# Patient Record
Sex: Male | Born: 1980 | Race: White | Hispanic: No | Marital: Married | Smoking: Light tobacco smoker
Health system: Southern US, Community
[De-identification: ages and names within clinical notes are randomized; demographics above are authoritative.]

## PROBLEM LIST (undated history)

## (undated) DIAGNOSIS — B019 Varicella without complication: Secondary | ICD-10-CM

## (undated) HISTORY — DX: Varicella without complication: B01.9

---
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
## 2013-10-14 ENCOUNTER — Ambulatory Visit (INDEPENDENT_AMBULATORY_CARE_PROVIDER_SITE_OTHER): Admitting: Internal Medicine

## 2013-10-14 ENCOUNTER — Encounter: Payer: Self-pay | Admitting: Internal Medicine

## 2013-10-14 VITALS — BP 128/84 | HR 71 | Temp 98.0°F | Ht 75.0 in | Wt 284.8 lb

## 2013-10-14 DIAGNOSIS — R41840 Attention and concentration deficit: Secondary | ICD-10-CM

## 2013-10-14 DIAGNOSIS — Z0000 Encounter for general adult medical examination without abnormal findings: Secondary | ICD-10-CM

## 2013-10-14 DIAGNOSIS — L570 Actinic keratosis: Secondary | ICD-10-CM

## 2013-10-14 DIAGNOSIS — E669 Obesity, unspecified: Secondary | ICD-10-CM | POA: Insufficient documentation

## 2013-10-14 LAB — COMPREHENSIVE METABOLIC PANEL
ALT: 90 U/L — ABNORMAL HIGH (ref 0–53)
AST: 57 U/L — ABNORMAL HIGH (ref 0–37)
Albumin: 4.5 g/dL (ref 3.5–5.2)
Alkaline Phosphatase: 77 U/L (ref 39–117)
Calcium: 9.7 mg/dL (ref 8.4–10.5)
Chloride: 107 mEq/L (ref 96–112)
Creatinine, Ser: 1.1 mg/dL (ref 0.4–1.5)
Glucose, Bld: 86 mg/dL (ref 70–99)
Potassium: 4.3 mEq/L (ref 3.5–5.1)
Sodium: 142 mEq/L (ref 135–145)
Total Bilirubin: 0.7 mg/dL (ref 0.3–1.2)
Total Protein: 7.4 g/dL (ref 6.0–8.3)

## 2013-10-14 LAB — CBC
MCHC: 33.3 g/dL (ref 30.0–36.0)
Platelets: 258 10*3/uL (ref 150.0–400.0)
RBC: 5.45 Mil/uL (ref 4.22–5.81)
RDW: 13.9 % (ref 11.5–14.6)

## 2013-10-14 LAB — HEMOGLOBIN A1C: Hgb A1c MFr Bld: 5.4 % (ref 4.6–6.5)

## 2013-10-14 LAB — TSH: TSH: 1.69 u[IU]/mL (ref 0.35–5.50)

## 2013-10-14 LAB — LIPID PANEL
HDL: 47.2 mg/dL (ref >39.00)
Total CHOL/HDL Ratio: 5

## 2013-10-14 NOTE — Progress Notes (Signed)
HPI Pt presents to the clinic today to establish care. He does not have a PCP. He does have some concerns today about some moles on his body, primarily on his right hip. He would like it checked today. He also recently started law school. He has some concerns about adult onset ADD. He has difficulty completing projects and problems focusing. He has taken online test that suggest he has ADD and he would like to know he could be formally diagnosed.   Flu: yearly- 2014 Tetanus: 2007 Dentist: yearly  No past medical history on file.  No current outpatient prescriptions on file.   No current facility-administered medications for this visit.    No Known Allergies  Family History  Problem Relation Age of Onset  . Diabetes Paternal Uncle   . Cancer Maternal Grandmother   . Heart disease Maternal Grandfather   . Heart disease Paternal Grandfather     History   Social History  . Marital Status: Married    Spouse Name: N/A    Number of Children: N/A  . Years of Education: N/A   Occupational History  . Not on file.   Social History Main Topics  . Smoking status: Light Tobacco Smoker  . Smokeless tobacco: Never Used     Comment: cigars 10-12 a year  . Alcohol Use: Yes     Comment: occasional  . Drug Use: Not on file  . Sexual Activity: Not on file   Other Topics Concern  . Not on file   Social History Narrative  . No narrative on file    ROS:  Constitutional: Denies fever, malaise, fatigue, headache or abrupt weight changes.  HEENT: Denies eye pain, eye redness, ear pain, ringing in the ears, wax buildup, runny nose, nasal congestion, bloody nose, or sore throat. Respiratory: Denies difficulty breathing, shortness of breath, cough or sputum production.   Cardiovascular: Denies chest pain, chest tightness, palpitations or swelling in the hands or feet.  Gastrointestinal: Denies abdominal pain, bloating, constipation, diarrhea or blood in the stool.  GU: Denies frequency,  urgency, pain with urination, blood in urine, odor or discharge. Musculoskeletal: Denies decrease in range of motion, difficulty with gait, muscle pain or joint pain and swelling.  Skin: Denies redness, rashes, or ulcercations.  Neurological: Denies dizziness, difficulty with memory, difficulty with speech or problems with balance and coordination.   No other specific complaints in a complete review of systems (except as listed in HPI above).  PE:  BP 128/84  Pulse 71  Temp(Src) 98 F (36.7 C) (Oral)  Ht 6\' 3"  (1.905 m)  Wt 284 lb 12 oz (129.162 kg)  BMI 35.59 kg/m2  SpO2 98% Wt Readings from Last 3 Encounters:  10/14/13 284 lb 12 oz (129.162 kg)    General: Appears this stated age, obese but well developed, well nourished in NAD. Skin: 8 mm irregular lesion noted on right hip.  HEENT: Head: normal shape and size; Eyes: sclera white, no icterus, conjunctiva pink, PERRLA and EOMs intact; Ears: Tm's gray and intact, normal light reflex; Nose: mucosa pink and moist, septum midline; Throat/Mouth: Teeth present, mucosa pink and moist, no lesions or ulcerations noted.  Neck: Normal range of motion. Neck supple, trachea midline. No massses, lumps or thyromegaly present.  Cardiovascular: Normal rate and rhythm. S1,S2 noted.  No murmur, rubs or gallops noted. No JVD or BLE edema. No carotid bruits noted. Pulmonary/Chest: Normal effort and positive vesicular breath sounds. No respiratory distress. No wheezes, rales or ronchi noted.  Abdomen: Soft and nontender. Normal bowel sounds, no bruits noted. No distention or masses noted. Liver, spleen and kidneys non palpable. Musculoskeletal: Normal range of motion. No signs of joint swelling. No difficulty with gait.  Neurological: Alert and oriented. Cranial nerves II-XII intact. Coordination normal. +DTRs bilaterally. Psychiatric: Mood and affect normal. Behavior is normal. Judgment and thought content normal.   Assessment and Plan:  Preventative  Health Maintenance:  Encouraged pt to work on diet and exercise Will obtain screening labs today Will refer to derm for evaluation of AK/SK on right hip Will refer to psych for evaluation of possible adult onset ADD  RTC in 1 year or sooner if needed

## 2013-10-14 NOTE — Progress Notes (Signed)
Pre-visit discussion using our clinic review tool. No additional management support is needed unless otherwise documented below in the visit note.  

## 2013-10-14 NOTE — Patient Instructions (Signed)
Health Maintenance, Males A healthy lifestyle and preventative care can promote health and wellness.  Maintain regular health, dental, and eye exams.  Eat a healthy diet. Foods like vegetables, fruits, whole grains, low-fat dairy products, and lean protein foods contain the nutrients you need without too many calories. Decrease your intake of foods high in solid fats, added sugars, and salt. Get information about a proper diet from your caregiver, if necessary.  Regular physical exercise is one of the most important things you can do for your health. Most adults should get at least 150 minutes of moderate-intensity exercise (any activity that increases your heart rate and causes you to sweat) each week. In addition, most adults need muscle-strengthening exercises on 2 or more days a week.   Maintain a healthy weight. The body mass index (BMI) is a screening tool to identify possible weight problems. It provides an estimate of body fat based on height and weight. Your caregiver can help determine your BMI, and can help you achieve or maintain a healthy weight. For adults 20 years and older:  A BMI below 18.5 is considered underweight.  A BMI of 18.5 to 24.9 is normal.  A BMI of 25 to 29.9 is considered overweight.  A BMI of 30 and above is considered obese.  Maintain normal blood lipids and cholesterol by exercising and minimizing your intake of saturated fat. Eat a balanced diet with plenty of fruits and vegetables. Blood tests for lipids and cholesterol should begin at age 20 and be repeated every 5 years. If your lipid or cholesterol levels are high, you are over 50, or you are a high risk for heart disease, you may need your cholesterol levels checked more frequently.Ongoing high lipid and cholesterol levels should be treated with medicines, if diet and exercise are not effective.  If you smoke, find out from your caregiver how to quit. If you do not use tobacco, do not start.  Lung  cancer screening is recommended for adults aged 55 80 years who are at high risk for developing lung cancer because of a history of smoking. Yearly low-dose computed tomography (CT) is recommended for people who have at least a 30-pack-year history of smoking and are a current smoker or have quit within the past 15 years. A pack year of smoking is smoking an average of 1 pack of cigarettes a day for 1 year (for example: 1 pack a day for 30 years or 2 packs a day for 15 years). Yearly screening should continue until the smoker has stopped smoking for at least 15 years. Yearly screening should also be stopped for people who develop a health problem that would prevent them from having lung cancer treatment.  If you choose to drink alcohol, do not exceed 2 drinks per day. One drink is considered to be 12 ounces (355 mL) of beer, 5 ounces (148 mL) of wine, or 1.5 ounces (44 mL) of liquor.  Avoid use of street drugs. Do not share needles with anyone. Ask for help if you need support or instructions about stopping the use of drugs.  High blood pressure causes heart disease and increases the risk of stroke. Blood pressure should be checked at least every 1 to 2 years. Ongoing high blood pressure should be treated with medicines if weight loss and exercise are not effective.  If you are 45 to 32 years old, ask your caregiver if you should take aspirin to prevent heart disease.  Diabetes screening involves taking a blood   sample to check your fasting blood sugar level. This should be done once every 3 years, after age 45, if you are within normal weight and without risk factors for diabetes. Testing should be considered at a younger age or be carried out more frequently if you are overweight and have at least 1 risk factor for diabetes.  Colorectal cancer can be detected and often prevented. Most routine colorectal cancer screening begins at the age of 50 and continues through age 75. However, your caregiver may  recommend screening at an earlier age if you have risk factors for colon cancer. On a yearly basis, your caregiver may provide home test kits to check for hidden blood in the stool. Use of a small camera at the end of a tube, to directly examine the colon (sigmoidoscopy or colonoscopy), can detect the earliest forms of colorectal cancer. Talk to your caregiver about this at age 50, when routine screening begins. Direct examination of the colon should be repeated every 5 to 10 years through age 75, unless early forms of pre-cancerous polyps or small growths are found.  Hepatitis C blood testing is recommended for all people born from 1945 through 1965 and any individual with known risks for hepatitis C.  Healthy men should no longer receive prostate-specific antigen (PSA) blood tests as part of routine cancer screening. Consult with your caregiver about prostate cancer screening.  Testicular cancer screening is not recommended for adolescents or adult males who have no symptoms. Screening includes self-exam, caregiver exam, and other screening tests. Consult with your caregiver about any symptoms you have or any concerns you have about testicular cancer.  Practice safe sex. Use condoms and avoid high-risk sexual practices to reduce the spread of sexually transmitted infections (STIs).  Use sunscreen. Apply sunscreen liberally and repeatedly throughout the day. You should seek shade when your shadow is shorter than you. Protect yourself by wearing long sleeves, pants, a wide-brimmed hat, and sunglasses year round, whenever you are outdoors.  Notify your caregiver of new moles or changes in moles, especially if there is a change in shape or color. Also notify your caregiver if a mole is larger than the size of a pencil eraser.  A one-time screening for abdominal aortic aneurysm (AAA) and surgical repair of large AAAs by sound wave imaging (ultrasonography) is recommended for ages 65 to 75 years who are  current or former smokers.  Stay current with your immunizations. Document Released: 03/31/2008 Document Revised: 01/28/2013 Document Reviewed: 02/28/2011 Exit[employer] Patient Information 2014 [employer].  

## 2013-10-15 ENCOUNTER — Other Ambulatory Visit: Payer: Self-pay | Admitting: Internal Medicine

## 2013-10-15 DIAGNOSIS — R748 Abnormal levels of other serum enzymes: Secondary | ICD-10-CM

## 2013-10-24 ENCOUNTER — Other Ambulatory Visit: Payer: Self-pay | Admitting: Internal Medicine

## 2013-10-24 DIAGNOSIS — R41840 Attention and concentration deficit: Secondary | ICD-10-CM

## 2013-11-08 ENCOUNTER — Ambulatory Visit
Admission: RE | Admit: 2013-11-08 | Discharge: 2013-11-08 | Disposition: A | Discharge status: Home / Self Care | Source: Ambulatory Visit | Attending: Internal Medicine | Admitting: Internal Medicine

## 2013-11-08 DIAGNOSIS — R748 Abnormal levels of other serum enzymes: Secondary | ICD-10-CM

## 2013-11-14 ENCOUNTER — Ambulatory Visit (INDEPENDENT_AMBULATORY_CARE_PROVIDER_SITE_OTHER): Admitting: Psychology

## 2013-11-14 DIAGNOSIS — F909 Attention-deficit hyperactivity disorder, unspecified type: Secondary | ICD-10-CM

## 2013-11-15 ENCOUNTER — Other Ambulatory Visit

## 2013-11-19 ENCOUNTER — Other Ambulatory Visit (INDEPENDENT_AMBULATORY_CARE_PROVIDER_SITE_OTHER)

## 2013-11-19 DIAGNOSIS — F909 Attention-deficit hyperactivity disorder, unspecified type: Secondary | ICD-10-CM

## 2013-11-28 ENCOUNTER — Ambulatory Visit (INDEPENDENT_AMBULATORY_CARE_PROVIDER_SITE_OTHER): Admitting: Psychology

## 2013-11-28 DIAGNOSIS — F909 Attention-deficit hyperactivity disorder, unspecified type: Secondary | ICD-10-CM

## 2013-12-12 ENCOUNTER — Ambulatory Visit: Admitting: Psychology

## 2013-12-26 ENCOUNTER — Ambulatory Visit (INDEPENDENT_AMBULATORY_CARE_PROVIDER_SITE_OTHER): Admitting: Psychology

## 2013-12-26 DIAGNOSIS — F909 Attention-deficit hyperactivity disorder, unspecified type: Secondary | ICD-10-CM

## 2013-12-27 ENCOUNTER — Encounter: Payer: Self-pay | Admitting: Radiology

## 2013-12-27 ENCOUNTER — Encounter: Payer: Self-pay | Admitting: Internal Medicine

## 2013-12-27 ENCOUNTER — Ambulatory Visit (INDEPENDENT_AMBULATORY_CARE_PROVIDER_SITE_OTHER): Admitting: Internal Medicine

## 2013-12-27 VITALS — BP 124/80 | HR 64 | Temp 97.9°F | Wt 283.0 lb

## 2013-12-27 DIAGNOSIS — R41840 Attention and concentration deficit: Secondary | ICD-10-CM

## 2013-12-27 MED ORDER — LISDEXAMFETAMINE DIMESYLATE 30 MG PO CAPS: 30.0000 mg | ORAL_CAPSULE | Freq: Every day | ORAL | Status: DC

## 2013-12-27 NOTE — Progress Notes (Signed)
Pre visit review using our clinic review tool, if applicable. No additional management support is needed unless otherwise documented below in the visit note. 

## 2013-12-27 NOTE — Patient Instructions (Addendum)
Lisdexamfetamine Oral Capsule What is this medicine? LISDEXAMFETAMINE (lis DEX am fet a meen) is used to treat attention-deficit hyperactivity disorder (ADHD). Federal law prohibits giving this medicine to any person other than the person for whom it was prescribed. Do not share this medicine with anyone else. This medicine may be used for other purposes; ask your health care provider or pharmacist if you have questions. COMMON BRAND NAME(S): Vyvanse What should I tell my health care provider before I take this medicine? They need to know if you have any of these conditions: -anxiety or panic attacks -circulation problems in fingers and toes -glaucoma -hardening or blockages of the arteries or heart blood vessels -heart disease or a heart defect -high blood pressure -history of a drug or alcohol abuse problem -history of stroke -kidney disease -liver disease -mental illness -seizures -suicidal thoughts, plans, or attempt; a previous suicide attempt by you or a family member -thyroid disease -Tourette's syndrome -an unusual or allergic reaction to lisdexamfetamine, other medicines, foods, dyes, or preservatives -pregnant or trying to get pregnant -breast-feeding How should I use this medicine? Take this medicine by mouth. Follow the directions on the prescription label. Swallow the capsules with a drink of water. You may open capsule and add to a glass of water, then drink right away. Take your doses at regular intervals. Do not take your medicine more often than directed. Do not suddenly stop your medicine. You must gradually reduce the dose or you may feel withdrawal effects. Ask your doctor or health care professional for advice. A special MedGuide will be given to you by the pharmacist with each prescription and refill. Be sure to read this information carefully each time. Talk to your pediatrician regarding the use of this medicine in children. While this drug may be prescribed for  children as young as 6 years of age for selected conditions, precautions do apply. Overdosage: If you think you have taken too much of this medicine contact a poison control center or emergency room at once. NOTE: This medicine is only for you. Do not share this medicine with others. What if I miss a dose? If you miss a dose, take it as soon as you can. If it is almost time for your next dose, take only that dose. Do not take double or extra doses. What may interact with this medicine? Do not take this medicine with any of the following medications: -certain medicines for migraine headache like almotriptan, eletriptan, frovatriptan, naratriptan, rizatriptan, sumatriptan, zolmitriptan -MAOIs like Carbex, Eldepryl, Marplan, Nardil, and Parnate -melatonin -meperidine -other stimulant medicines for attention disorders, weight loss, or to stay awake -pimozide -procarbazine This medicine may also interact with the following medications: -acetazolamide -ammonium chloride -antacids -ascorbic acid -atomoxetine -caffeine -certain medicines for blood pressure -certain medicines for depression, anxiety, or psychotic disturbances -certain medicines for seizures like carbamazepine, phenobarbital, phenytoin -certain medicines for stomach problems like cimetidine, famotidine, omeprazole, lansoprazole -cold or allergy medicines -green tea -levodopa -linezolid -medicines for sleep during surgery -methenamine -norepinephrine -phenothiazines like chlorpromazine, mesoridazine, prochlorperazine, thioridazine -propoxyphene -sodium acid phosphate -sodium bicarbonate This list may not describe all possible interactions. Give your health care provider a list of all the medicines, herbs, non-prescription drugs, or dietary supplements you use. Also tell them if you smoke, drink alcohol, or use illegal drugs. Some items may interact with your medicine. What should I watch for while using this  medicine? Visit your doctor for regular check ups. This prescription requires that you follow special procedures with your   doctor and pharmacy. You will need to have a new written prescription from your doctor every time you need a refill. This medicine may affect your concentration, or hide signs of tiredness. Until you know how this medicine affects you, do not drive, ride a bicycle, use machinery, or do anything that needs mental alertness. Tell your doctor or health care professional if this medicine loses its effects, or if you feel you need to take more than the prescribed amount. Do not change your dose without talking to your doctor or health care professional. Decreased appetite is a common side effect when starting this medicine. Eating small, frequent meals or snacks can help. Talk to your doctor if you continue to have poor eating habits. Height and weight growth of a child taking this medicine will be monitored closely. Do not take this medicine close to bedtime. It may prevent you from sleeping. If you are going to need surgery, a MRI, CT scan, or other procedure, tell your doctor that you are taking this medicine. You may need to stop taking this medicine before the procedure. Tell your doctor or healthcare professional right away if you notice unexplained wounds on your fingers and toes while taking this medicine. You should also tell your healthcare provider if you experience numbness or pain, changes in the skin color, or sensitivity to temperature in your fingers or toes. What side effects may I notice from receiving this medicine? Side effects that you should report to your doctor or health care professional as soon as possible: -allergic reactions like skin rash, itching or hives, swelling of the face, lips, or tongue -changes in vision -chest pain or chest tightness -confusion, trouble speaking or understanding -fast, irregular heartbeat -fingers or toes feel numb, cool,  painful -hallucination, loss of contact with reality -high blood pressure -males: prolonged or painful erection -seizures -severe headaches -shortness of breath -suicidal thoughts or other mood changes -trouble walking, dizziness, loss of balance or coordination -uncontrollable head, mouth, neck, arm, or leg movements Side effects that usually do not require medical attention (report to your doctor or health care professional if they continue or are bothersome): -anxious -headache -loss of appetite -nausea, vomiting -trouble sleeping -weight loss This list may not describe all possible side effects. Call your doctor for medical advice about side effects. You may report side effects to FDA at 1-800-FDA-1088. Where should I keep my medicine? Keep out of the reach of children. This medicine can be abused. Keep your medicine in a safe place to protect it from theft. Do not share this medicine with anyone. Selling or giving away this medicine is dangerous and against the law. Store at room temperature between 15 and 30 degrees C (59 and 86 degrees F). Protect from light. Keep container tightly closed. Throw away any unused medicine after the expiration date. NOTE: This sheet is a summary. It may not cover all possible information. If you have questions about this medicine, talk to your doctor, pharmacist, or health care provider.  2014, [employer]vier/[employer]. (2013-06-24 13:24:34)  

## 2013-12-27 NOTE — Progress Notes (Signed)
Subjective:    Patient ID: Marco Allen, male    DOB: 1981-09-30, 33 y.o.   MRN: [id number]  HPI  Pt presents to the clinic today to f/u inattention and procrastination. 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 He say Salomon Fick, clinical psychologist for ADD testing. She spoke with me yesterday. She felt like he would benefit from a trial of low dose Vyvanse.  Review of Systems  Past Medical History  Diagnosis Date  . Chicken pox     No current outpatient prescriptions on file.   No current facility-administered medications for this visit.    No Known Allergies  Family History  Problem Relation Age of Onset  . Diabetes Paternal Uncle   . Cancer Maternal Grandmother   . Heart disease Maternal Grandfather   . Heart disease Paternal Grandfather     History   Social History  . Marital Status: Married    Spouse Name: N/A    Number of Children: N/A  . Years of Education: N/A   Occupational History  . Not on file.   Social History Main Topics  . Smoking status: Light Tobacco Smoker  . Smokeless tobacco: Never Used     Comment: cigars 10-12 a year  . Alcohol Use: 4.2 oz/week    7 Shots of liquor per week     Comment: occasional  . Drug Use: No  . Sexual Activity: Yes   Other Topics Concern  . Not on file   Social History Narrative  . No narrative on file     Constitutional: Denies fever, malaise, fatigue, headache or abrupt weight changes.   Neurological: Denies dizziness, difficulty with memory, difficulty with speech or problems with balance and coordination.  Psych: Pt reports inattention, difficulty focusing and issues with procrastination. Denies depression, SI/HI.  No other specific complaints in a complete review of systems (except as listed in HPI above).     Objective:   Physical Exam  BP 124/80  Pulse 64  Temp(Src) 97.9 F (36.6 C) (Oral)  Wt 283 lb (128.368 kg)  SpO2 98% Wt Readings from Last 3 Encounters:  12/27/13  283 lb (128.368 kg)  10/14/13 284 lb 12 oz (129.162 kg)    General: Appears his stated age, well developed, well nourished in NAD. Cardiovascular: Normal rate and rhythm. S1,S2 noted.  No murmur, rubs or gallops noted. No JVD or BLE edema. No carotid bruits noted. Pulmonary/Chest: Normal effort and positive vesicular breath sounds. No respiratory distress. No wheezes, rales or ronchi noted.  Psychiatric: Mood and affect normal. Behavior is normal. Judgment and thought content normal.     BMET    Component Value Date/Time   NA 142 10/14/2013 1005   K 4.3 10/14/2013 1005   CL 107 10/14/2013 1005   CO2 21 10/14/2013 1005   GLUCOSE 86 10/14/2013 1005   BUN 12 10/14/2013 1005   CREATININE 1.1 10/14/2013 1005   CALCIUM 9.7 10/14/2013 1005    Lipid Panel     Component Value Date/Time   CHOL 218* 10/14/2013 1005   TRIG 131.0 10/14/2013 1005   HDL 47.20 10/14/2013 1005   CHOLHDL 5 10/14/2013 1005   VLDL 26.2 10/14/2013 1005    CBC    Component Value Date/Time   WBC 6.9 10/14/2013 1005   RBC 5.45 10/14/2013 1005   HGB 15.8 10/14/2013 1005   HCT 47.4 10/14/2013 1005   PLT 258.0 10/14/2013 1005   MCV 87.0 10/14/2013 1005  MCHC 33.3 10/14/2013 1005   RDW 13.9 10/14/2013 1005    Hgb A1C Lab Results  Component Value Date   HGBA1C 5.4 10/14/2013         Assessment & Plan:   Inattention and difficulty focus:  Will start low dose Vyvanse Continue to follow Salomon Fickerri Bauert Will need to set up with [facility]  RTC in 1 month to reassess medication effectiveness

## 2013-12-30 ENCOUNTER — Telehealth: Payer: Self-pay | Admitting: Internal Medicine

## 2013-12-30 NOTE — Telephone Encounter (Signed)
Relevant patient education assigned to patient using Emmi. ° °

## 2014-01-09 ENCOUNTER — Ambulatory Visit (INDEPENDENT_AMBULATORY_CARE_PROVIDER_SITE_OTHER): Admitting: Psychology

## 2014-01-09 DIAGNOSIS — F909 Attention-deficit hyperactivity disorder, unspecified type: Secondary | ICD-10-CM

## 2014-01-28 ENCOUNTER — Other Ambulatory Visit: Payer: Self-pay | Admitting: Family Medicine

## 2014-01-28 DIAGNOSIS — R41840 Attention and concentration deficit: Secondary | ICD-10-CM

## 2014-01-28 MED ORDER — LISDEXAMFETAMINE DIMESYLATE 30 MG PO CAPS: 30.0000 mg | ORAL_CAPSULE | Freq: Every day | ORAL | Status: DC

## 2014-01-28 NOTE — Telephone Encounter (Signed)
Last filled 12/27/13 

## 2014-01-29 NOTE — Telephone Encounter (Signed)
Rx faxed to [facility] 

## 2014-01-30 ENCOUNTER — Ambulatory Visit (INDEPENDENT_AMBULATORY_CARE_PROVIDER_SITE_OTHER): Admitting: Psychology

## 2014-01-30 DIAGNOSIS — F909 Attention-deficit hyperactivity disorder, unspecified type: Secondary | ICD-10-CM

## 2014-01-31 ENCOUNTER — Telehealth: Payer: Self-pay

## 2014-01-31 NOTE — Telephone Encounter (Signed)
Pt left note; pt saw Terri Bauert and Terri recommended pts Vyvanxe increased from 30 mg to 40 mg. Pt request cb.

## 2014-02-03 ENCOUNTER — Other Ambulatory Visit: Payer: Self-pay

## 2014-02-03 ENCOUNTER — Other Ambulatory Visit: Payer: Self-pay | Admitting: Internal Medicine

## 2014-02-03 MED ORDER — LISDEXAMFETAMINE DIMESYLATE 40 MG PO CAPS: 40.0000 mg | ORAL_CAPSULE | ORAL | Status: DC

## 2014-02-03 NOTE — Telephone Encounter (Signed)
Done, he will need to finish out his current prescription first, then fill the 40 mg tabs

## 2014-02-03 NOTE — Telephone Encounter (Signed)
lmovm for pt to return call--Rx is ready to be picked up and cannot be filled until after 02/27/2014--upon finishing previous Rx

## 2014-02-11 NOTE — Telephone Encounter (Signed)
lmovm for pt to return call--Rx left in front office for pick up

## 2014-02-27 ENCOUNTER — Ambulatory Visit (INDEPENDENT_AMBULATORY_CARE_PROVIDER_SITE_OTHER): Admitting: Psychology

## 2014-02-27 DIAGNOSIS — F909 Attention-deficit hyperactivity disorder, unspecified type: Secondary | ICD-10-CM

## 2014-04-02 ENCOUNTER — Other Ambulatory Visit: Payer: Self-pay

## 2014-04-02 MED ORDER — LISDEXAMFETAMINE DIMESYLATE 40 MG PO CAPS: 40.0000 mg | ORAL_CAPSULE | ORAL | Status: DC

## 2014-04-02 NOTE — Telephone Encounter (Signed)
RX printed and signed 

## 2014-04-02 NOTE — Telephone Encounter (Signed)
Pt left v/m requesting rx vyvanse.call when ready for pick up.

## 2014-04-02 NOTE — Telephone Encounter (Signed)
Rx left in front office for pick up and pt is aware  

## 2014-05-09 ENCOUNTER — Other Ambulatory Visit: Payer: Self-pay

## 2014-05-09 NOTE — Telephone Encounter (Signed)
Pt left v/m requesting rx Vyvanse. Call when ready for pick up. 

## 2014-05-12 MED ORDER — LISDEXAMFETAMINE DIMESYLATE 40 MG PO CAPS: 40.0000 mg | ORAL_CAPSULE | ORAL | Status: DC

## 2014-05-12 NOTE — Telephone Encounter (Signed)
Rx printed and signed, placed in your inbox

## 2014-05-12 NOTE — Telephone Encounter (Signed)
lmovm for pt to return my call--Rx placed in front office for pt to pick up

## 2014-05-14 NOTE — Telephone Encounter (Signed)
Pt returned call and he is aware of Rx being ready for pick up

## 2014-06-20 ENCOUNTER — Other Ambulatory Visit: Payer: Self-pay

## 2014-06-20 NOTE — Telephone Encounter (Signed)
Pt left v/m requesting rx vyvanse. Call when ready for pick up. 

## 2014-06-24 MED ORDER — LISDEXAMFETAMINE DIMESYLATE 40 MG PO CAPS: 40.0000 mg | ORAL_CAPSULE | ORAL | Status: DC

## 2014-06-24 NOTE — Telephone Encounter (Signed)
Pt walked into office and picked up Rx

## 2014-06-24 NOTE — Telephone Encounter (Signed)
RX printed and signed 

## 2014-08-01 ENCOUNTER — Telehealth: Payer: Self-pay

## 2014-08-01 NOTE — Telephone Encounter (Signed)
[facility] okay [facility] med, pt said he didn't need the 40mg  refilled right now he will wait

## 2014-08-01 NOTE — Telephone Encounter (Signed)
Ok - will make sure this is routed to her

## 2014-08-01 NOTE — Telephone Encounter (Signed)
Pt left v/m; pt request increase of Vyvanse to 50 mg instead of 40 mg. Pt cannot come in for appt at this time and if cannot increase med please refill Vyvanse at 40 mg. Pt request cb when rx ready for pick up.

## 2014-08-01 NOTE — Telephone Encounter (Signed)
Since he is Regina's pt- an increase needs to go by her - ? If she is here - if she is gone and he just wants refill of 40 I can do that /otherwise please send to her for her response

## 2014-08-07 NOTE — Telephone Encounter (Signed)
Pt left vm requesting status of vyvanse 50 mg rx. Pt request cb when answered by Nicki Reaperegina Baity NP.

## 2014-08-08 ENCOUNTER — Telehealth: Payer: Self-pay | Admitting: Internal Medicine

## 2014-08-08 NOTE — Telephone Encounter (Signed)
Left message on voicemail.

## 2014-08-08 NOTE — Telephone Encounter (Signed)
Patient returned your call.

## 2014-08-08 NOTE — Telephone Encounter (Signed)
I have not seen him since march. He needs to follow up to discuss.d

## 2014-08-08 NOTE — Telephone Encounter (Signed)
[facility]

## 2014-08-15 NOTE — Telephone Encounter (Signed)
Left message on voicemail.

## 2014-08-22 ENCOUNTER — Encounter: Payer: Self-pay | Admitting: Internal Medicine

## 2014-08-22 ENCOUNTER — Ambulatory Visit (INDEPENDENT_AMBULATORY_CARE_PROVIDER_SITE_OTHER): Admitting: Internal Medicine

## 2014-08-22 VITALS — BP 132/82 | HR 89 | Temp 98.7°F | Wt 258.0 lb

## 2014-08-22 DIAGNOSIS — R682 Dry mouth, unspecified: Secondary | ICD-10-CM

## 2014-08-22 DIAGNOSIS — Z23 Encounter for immunization: Secondary | ICD-10-CM

## 2014-08-22 DIAGNOSIS — F988 Other specified behavioral and emotional disorders with onset usually occurring in childhood and adolescence: Secondary | ICD-10-CM | POA: Insufficient documentation

## 2014-08-22 DIAGNOSIS — F909 Attention-deficit hyperactivity disorder, unspecified type: Secondary | ICD-10-CM

## 2014-08-22 MED ORDER — LISDEXAMFETAMINE DIMESYLATE 60 MG PO CAPS: 60.0000 mg | ORAL_CAPSULE | ORAL | Status: DC

## 2014-08-22 NOTE — Progress Notes (Signed)
Pre visit review using our clinic review tool, if applicable. No additional management support is needed unless otherwise documented below in the visit note. 

## 2014-08-22 NOTE — Assessment & Plan Note (Signed)
Will increase Vyvanse to 60 mg daily Watch for increased dry mouth- suck on hard candy (this will help)

## 2014-08-22 NOTE — Patient Instructions (Signed)

## 2014-08-22 NOTE — Progress Notes (Signed)
Subjective:    Patient ID: Marco Allen, male    DOB: 10-25-1980, 33 y.o.   MRN: [id number]  HPI  Pt presents to the clinic today to discuss increasing his Vyvanse. He was started on this medication 3/15 after evaluation from Theresia Loerri Bauret, psychologist. She noted that he did have issues focusing and with inattention. 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 Since starting the medication, he has noticed that the medication wears off pretty quickly. It seems to only last 5-6 hours. He is only taking it a few days per week, on his long days. He has noticed some dry mouth but no other side effects. Of note, he has lost 25 lbs since starting this medication. He does attribute this to increase in his diet and exercise and not due to the medication.  Review of Systems      Past Medical History  Diagnosis Date  . Chicken pox     Current Outpatient Prescriptions  Medication Sig Dispense Refill  . lisdexamfetamine (VYVANSE) 40 MG capsule Take 1 capsule (40 mg total) by mouth every morning. 30 capsule 0   No current facility-administered medications for this visit.    No Known Allergies  Family History  Problem Relation Age of Onset  . Diabetes Paternal Uncle   . Cancer Maternal Grandmother   . Heart disease Maternal Grandfather   . Heart disease Paternal Grandfather     History   Social History  . Marital Status: Married    Spouse Name: N/A    Number of Children: N/A  . Years of Education: N/A   Occupational History  . Not on file.   Social History Main Topics  . Smoking status: Light Tobacco Smoker  . Smokeless tobacco: Never Used     Comment: cigars 10-12 a year  . Alcohol Use: 4.2 oz/week    7 Shots of liquor per week     Comment: occasional  . Drug Use: No  . Sexual Activity: Yes   Other Topics Concern  . Not on file   Social History Narrative     Constitutional: Denies fever, malaise, fatigue, headache or abrupt weight  changes.  Respiratory: Denies difficulty breathing, shortness of breath, cough or sputum production.   Cardiovascular: Denies chest pain, chest tightness, palpitations or swelling in the hands or feet.  Neurological: Pt reports inattention and difficulty focusing. Denies dizziness, difficulty with memory, difficulty with speech or problems with balance and coordination.   No other specific complaints in a complete review of systems (except as listed in HPI above).  Objective:   Physical Exam  BP 132/82 mmHg  Pulse 89  Temp(Src) 98.7 F (37.1 C) (Oral)  Wt 258 lb (117.028 kg)  SpO2 98% Wt Readings from Last 3 Encounters:  08/22/14 258 lb (117.028 kg)  12/27/13 283 lb (128.368 kg)  10/14/13 284 lb 12 oz (129.162 kg)    General: Appears his stated age, obese but well developed, well nourished in NAD. HEENT: Head: normal shape and size; Throat/Mouth: Teeth present, mucosa pink and dry, no exudate, lesions or ulcerations noted.  Cardiovascular: Normal rate and rhythm. S1,S2 noted.  No murmur, rubs or gallops noted.  Pulmonary/Chest: Normal effort and positive vesicular breath sounds. No respiratory distress. No wheezes, rales or ronchi noted.  Neurological: Alert and oriented. Cranial nerves II-XII grossly intact.  Psychiatric: Mood and affect normal. Behavior is normal. Judgment and thought content normal.     BMET  Component Value Date/Time   NA 142 10/14/2013 1005   K 4.3 10/14/2013 1005   CL 107 10/14/2013 1005   CO2 21 10/14/2013 1005   GLUCOSE 86 10/14/2013 1005   BUN 12 10/14/2013 1005   CREATININE 1.1 10/14/2013 1005   CALCIUM 9.7 10/14/2013 1005    Lipid Panel     Component Value Date/Time   CHOL 218* 10/14/2013 1005   TRIG 131.0 10/14/2013 1005   HDL 47.20 10/14/2013 1005   CHOLHDL 5 10/14/2013 1005   VLDL 26.2 10/14/2013 1005    CBC    Component Value Date/Time   WBC 6.9 10/14/2013 1005   RBC 5.45 10/14/2013 1005   HGB 15.8 10/14/2013 1005   HCT  47.4 10/14/2013 1005   PLT 258.0 10/14/2013 1005   MCV 87.0 10/14/2013 1005   MCHC 33.3 10/14/2013 1005   RDW 13.9 10/14/2013 1005    Hgb A1C Lab Results  Component Value Date   HGBA1C 5.4 10/14/2013         Assessment & Plan:   Flu shot today

## 2014-08-22 NOTE — Addendum Note (Signed)
Addended by: Roena MaladyEVONTENNO, Monserrath Junio Y on: 08/22/2014 03:17 PM   Modules accepted: Orders

## 2014-10-24 ENCOUNTER — Encounter: Payer: Self-pay | Admitting: Internal Medicine

## 2014-10-24 ENCOUNTER — Ambulatory Visit (INDEPENDENT_AMBULATORY_CARE_PROVIDER_SITE_OTHER): Admitting: Internal Medicine

## 2014-10-24 VITALS — BP 134/88 | HR 84 | Temp 98.1°F | Wt 252.0 lb

## 2014-10-24 DIAGNOSIS — F988 Other specified behavioral and emotional disorders with onset usually occurring in childhood and adolescence: Secondary | ICD-10-CM

## 2014-10-24 DIAGNOSIS — F909 Attention-deficit hyperactivity disorder, unspecified type: Secondary | ICD-10-CM

## 2014-10-24 DIAGNOSIS — K760 Fatty (change of) liver, not elsewhere classified: Secondary | ICD-10-CM | POA: Insufficient documentation

## 2014-10-24 MED ORDER — LISDEXAMFETAMINE DIMESYLATE 50 MG PO CAPS: 50.0000 mg | ORAL_CAPSULE | Freq: Every day | ORAL | Status: DC

## 2014-10-24 NOTE — Progress Notes (Deleted)
Subjective:     Patient ID: Marco GalasJoshua Allen, male   DOB: 01/11/1981, 34 y.o.   MRN: [id number]  HPI   Review of Systems     Objective:   Physical Exam     Assessment:     ***    Plan:     ***

## 2014-10-24 NOTE — Patient Instructions (Signed)
Fatty Liver  Fatty liver is the accumulation of fat in liver cells. It is also called hepatosteatosis or steatohepatitis. It is normal for your liver to contain some fat. If fat is more than 5 to 10% of your liver's weight, you have fatty liver.   There are often no symptoms (problems) for years while damage is still occurring. People often learn about their fatty liver when they have medical tests for other reasons. Fat can damage your liver for years or even decades without causing problems. When it becomes severe, it can cause fatigue, weight loss, weakness, and confusion.  This makes you more likely to develop more serious liver problems. The liver is the largest organ in the body. It does a lot of work and often gives no warning signs when it is sick until late in a disease.  The liver has many important jobs including:  · Breaking down foods.  · Storing vitamins, iron, and other minerals.  · Making proteins.  · Making bile for food digestion.  · Breaking down many products including medications, alcohol and some poisons.  CAUSES   There are a number of different conditions, medications, and poisons that can cause a fatty liver. Eating too many calories causes fat to build up in the liver. Not processing and breaking fats down normally may also cause this. Certain conditions, such as obesity, diabetes, and high triglycerides also cause this. Most fatty liver patients tend to be middle-aged and over weight.   Some causes of fatty liver are:  · Alcohol over consumption.  · Malnutrition.  · Steroid use.  · Valproic acid toxicity.  · Obesity.  · Cushing's syndrome.  · Poisons.  · Tetracycline in high dosages.  · Pregnancy.  · Diabetes.  · Hyperlipidemia.  · Rapid weight loss.  Some people develop fatty liver even having none of these conditions.  SYMPTOMS   Fatty liver most often causes no problems. This is called asymptomatic.  · It can be diagnosed with blood tests and also by a liver biopsy.  · It is one of the  most common causes of minor elevations of liver enzymes on routine blood tests.  · Specialized Imaging of the liver using ultrasound, CT (computed tomography) scan, or MRI (magnetic resonance imaging) can suggest a fatty liver but a biopsy is needed to confirm it.  · A biopsy involves taking a small sample of liver tissue. This is done by using a needle. It is then looked at under a microscope by a specialist.  TREATMENT   It is important to treat the cause. Simple fatty liver without a medical reason may not need treatment.  · Weight loss, fat restriction, and exercise in overweight patients produces inconsistent results but is worth trying.  · Fatty liver due to alcohol toxicity may not improve even with stopping drinking.  · Good control of diabetes may reduce fatty liver.  · Lower your triglycerides through diet, medication or both.  · Eat a balanced, healthy diet.  · Increase your physical activity.  · Get regular checkups from a liver specialist.  · There are no medical or surgical treatments for a fatty liver or NASH, but improving your diet and increasing your exercise may help prevent or reverse some of the damage.  PROGNOSIS   Fatty liver may cause no damage or it can lead to an inflammation of the liver. This is, called steatohepatitis. When it is linked to alcohol abuse, it is called alcoholic steatohepatitis. It often   is not linked to alcohol. It is then called nonalcoholic steatohepatitis, or NASH. Over time the liver may become scarred and hardened. This condition is called cirrhosis. Cirrhosis is serious and may lead to liver failure or cancer. NASH is one of the leading causes of cirrhosis. About 10-20% of Americans have fatty liver and a smaller 2-5% has NASH.  Document Released: 11/18/2005 Document Revised: 12/26/2011 Document Reviewed: 02/12/2014  [employer]® Patient Information ©2015 [employer]. This information is not intended to replace advice given to you by your health care provider. Make  sure you discuss any questions you have with your health care provider.

## 2014-10-24 NOTE — Progress Notes (Signed)
Subjective:    Patient ID: Marco Allen, male    DOB: February 13, 1981, 34 y.o.   MRN: [id number]  HPI  Pt presents to the clinic today to follow up ADD. He reports the Vyvanse 60 mg was too strong. It made him feel jittery. He also had some insomnia that he feels was caused by the dose being too high. He reports the 40 mg was not strong enough. He is wondering if he is able to try the 50 mg dose.  Additionally, he would like to have his liver enzymes rechecked. He had elevated liver enzymes 09/2013. Abdominal ultrasound showed fatty liver. He has not had his liver checked in the last year.  Review of Systems      Past Medical History  Diagnosis Date  . Chicken pox     Current Outpatient Prescriptions  Medication Sig Dispense Refill  . lisdexamfetamine (VYVANSE) 50 MG capsule Take 1 capsule (50 mg total) by mouth daily. 30 capsule 0   No current facility-administered medications for this visit.    No Known Allergies  Family History  Problem Relation Age of Onset  . Diabetes Paternal Uncle   . Cancer Maternal Grandmother   . Heart disease Maternal Grandfather   . Heart disease Paternal Grandfather     History   Social History  . Marital Status: Married    Spouse Name: N/A    Number of Children: N/A  . Years of Education: N/A   Occupational History  . Not on file.   Social History Main Topics  . Smoking status: Light Tobacco Smoker  . Smokeless tobacco: Never Used     Comment: cigars 10-12 a year  . Alcohol Use: 4.2 oz/week    7 Shots of liquor per week     Comment: occasional  . Drug Use: No  . Sexual Activity: Yes   Other Topics Concern  . Not on file   Social History Narrative     Constitutional: Denies fever, malaise, fatigue, headache or abrupt weight changes.  Respiratory: Denies difficulty breathing, shortness of breath, cough or sputum production.   Cardiovascular: Denies chest pain, chest tightness, palpitations or swelling in the hands or feet.    Gastrointestinal: Denies abdominal pain, bloating, constipation, diarrhea or blood in the stool.  Neurological: Pt reports inattention and difficulty focusing. Denies dizziness, difficulty with speech or problems with balance and coordination.   No other specific complaints in a complete review of systems (except as listed in HPI above).  Objective:   Physical Exam  BP 134/88 mmHg  Pulse 84  Temp(Src) 98.1 F (36.7 C) (Oral)  Wt 252 lb (114.306 kg)  SpO2 99% Wt Readings from Last 3 Encounters:  10/24/14 252 lb (114.306 kg)  08/22/14 258 lb (117.028 kg)  12/27/13 283 lb (128.368 kg)    General: Appears his stated age, well developed, well nourished in NAD.Marland Kitchen  Cardiovascular: Normal rate and rhythm. S1,S2 noted.  No murmur, rubs or gallops noted.  Pulmonary/Chest: Normal effort and positive vesicular breath sounds. No respiratory distress. No wheezes, rales or ronchi noted.  Abdomen: Soft and nontender. Normal bowel sounds, no bruits noted. No distention or masses noted. Liver, spleen and kidneys non palpable. Neurological: Alert and oriented. Psychiatric: Mood and affect normal. Behavior is normal. Judgment and thought content normal.    BMET    Component Value Date/Time   NA 142 10/14/2013 1005   K 4.3 10/14/2013 1005   CL 107 10/14/2013 1005   CO2 21 10/14/2013  1005   GLUCOSE 86 10/14/2013 1005   BUN 12 10/14/2013 1005   CREATININE 1.1 10/14/2013 1005   CALCIUM 9.7 10/14/2013 1005    Lipid Panel     Component Value Date/Time   CHOL 218* 10/14/2013 1005   TRIG 131.0 10/14/2013 1005   HDL 47.20 10/14/2013 1005   CHOLHDL 5 10/14/2013 1005   VLDL 26.2 10/14/2013 1005    CBC    Component Value Date/Time   WBC 6.9 10/14/2013 1005   RBC 5.45 10/14/2013 1005   HGB 15.8 10/14/2013 1005   HCT 47.4 10/14/2013 1005   PLT 258.0 10/14/2013 1005   MCV 87.0 10/14/2013 1005   MCHC 33.3 10/14/2013 1005   RDW 13.9 10/14/2013 1005    Hgb A1C Lab Results  Component  Value Date   HGBA1C 5.4 10/14/2013         Assessment & Plan:

## 2014-10-24 NOTE — Assessment & Plan Note (Signed)
Will try the 50 mg dose of Vyvanse

## 2014-10-24 NOTE — Progress Notes (Signed)
Pre visit review using our clinic review tool, if applicable. No additional management support is needed unless otherwise documented below in the visit note. 

## 2014-10-24 NOTE — Assessment & Plan Note (Signed)
Will repeat CMET today- pt left without having labs done

## 2014-10-30 ENCOUNTER — Ambulatory Visit (INDEPENDENT_AMBULATORY_CARE_PROVIDER_SITE_OTHER): Admitting: Internal Medicine

## 2014-10-30 ENCOUNTER — Encounter: Payer: Self-pay | Admitting: Internal Medicine

## 2014-10-30 VITALS — BP 128/84 | HR 88 | Temp 99.0°F | Ht 74.66 in | Wt 257.0 lb

## 2014-10-30 DIAGNOSIS — Z0000 Encounter for general adult medical examination without abnormal findings: Secondary | ICD-10-CM

## 2014-10-30 LAB — COMPREHENSIVE METABOLIC PANEL
ALT: 34 U/L (ref 0–53)
AST: 25 U/L (ref 0–37)
Albumin: 4.5 g/dL (ref 3.5–5.2)
Alkaline Phosphatase: 84 U/L (ref 39–117)
BUN: 11 mg/dL (ref 6–23)
CO2: 27 mEq/L (ref 19–32)
CREATININE: 1.01 mg/dL (ref 0.40–1.50)
Calcium: 9.5 mg/dL (ref 8.4–10.5)
Chloride: 105 mEq/L (ref 96–112)
GFR: 90.06 mL/min (ref >60.00)
GLUCOSE: 91 mg/dL (ref 70–99)
POTASSIUM: 3.9 meq/L (ref 3.5–5.1)
Sodium: 138 mEq/L (ref 135–145)
Total Bilirubin: 0.5 mg/dL (ref 0.2–1.2)
Total Protein: 7.4 g/dL (ref 6.0–8.3)

## 2014-10-30 LAB — CBC
HCT: 47 % (ref 39.0–52.0)
Hemoglobin: 15.4 g/dL (ref 13.0–17.0)
MCHC: 32.9 g/dL (ref 30.0–36.0)
MCV: 87.6 fl (ref 78.0–100.0)
PLATELETS: 304 10*3/uL (ref 150.0–400.0)
RBC: 5.36 Mil/uL (ref 4.22–5.81)
RDW: 13 % (ref 11.5–15.5)
WBC: 9.1 10*3/uL (ref 4.0–10.5)

## 2014-10-30 LAB — LIPID PANEL
CHOL/HDL RATIO: 4
Cholesterol: 174 mg/dL (ref 0–200)
HDL: 45.8 mg/dL (ref >39.00)
LDL Cholesterol: 91 mg/dL (ref 0–99)
NonHDL: 128.2
Triglycerides: 187 mg/dL — ABNORMAL HIGH (ref 0.0–149.0)
VLDL: 37.4 mg/dL (ref 0.0–40.0)

## 2014-10-30 NOTE — Progress Notes (Signed)
Pre visit review using our clinic review tool, if applicable. No additional management support is needed unless otherwise documented below in the visit note. 

## 2014-10-30 NOTE — Progress Notes (Signed)
Subjective:    Patient ID: Marco Allen, male    DOB: 1981/08/22, 34 y.o.   MRN: [id number]  HPI  Pt presents to the clinic today for his physical exam. He would also like to have his liver functions rechecked today as they were slightly elevated today.  Flu: 08/2014 Tetanus: 2007 Dentist: biannually  Diet: Eating smaller portions, incorporated more fruits and veggies in his diet Exercise: Physical training while be in the [employer].  Review of Systems      Past Medical History  Diagnosis Date  . Chicken pox     Current Outpatient Prescriptions  Medication Sig Dispense Refill  . lisdexamfetamine (VYVANSE) 50 MG capsule Take 1 capsule (50 mg total) by mouth daily. 30 capsule 0   No current facility-administered medications for this visit.    No Known Allergies  Family History  Problem Relation Age of Onset  . Diabetes Paternal Uncle   . Cancer Maternal Grandmother   . Heart disease Maternal Grandfather   . Heart disease Paternal Grandfather     History   Social History  . Marital Status: Married    Spouse Name: N/A    Number of Children: N/A  . Years of Education: N/A   Occupational History  . Not on file.   Social History Main Topics  . Smoking status: Light Tobacco Smoker  . Smokeless tobacco: Never Used     Comment: cigars 10-12 a year  . Alcohol Use: 4.2 oz/week    7 Shots of liquor per week     Comment: occasional  . Drug Use: No  . Sexual Activity: Yes   Other Topics Concern  . Not on file   Social History Narrative     Constitutional: Denies fever, malaise, fatigue, headache or abrupt weight changes.  HEENT: Denies eye pain, eye redness, ear pain, ringing in the ears, wax buildup, runny nose, nasal congestion, bloody nose, or sore throat. Respiratory: Denies difficulty breathing, shortness of breath, cough or sputum production.   Cardiovascular: Denies chest pain, chest tightness, palpitations or swelling in the hands or feet.    Gastrointestinal: Denies abdominal pain, bloating, constipation, diarrhea or blood in the stool.  GU: Denies urgency, frequency, pain with urination, burning sensation, blood in urine, odor or discharge. Musculoskeletal: Denies decrease in range of motion, difficulty with gait, muscle pain or joint pain and swelling.  Skin: Denies redness, rashes, lesions or ulcercations.  Neurological: Denies dizziness, difficulty with memory, difficulty with speech or problems with balance and coordination.   No other specific complaints in a complete review of systems (except as listed in HPI above).  Objective:   Physical Exam  BP 128/84 mmHg  Pulse 88  Temp(Src) 99 F (37.2 C) (Oral)  Ht 6' 2.66" (1.896 m)  Wt 257 lb (116.574 kg)  BMI 32.43 kg/m2  SpO2 98% Wt Readings from Last 3 Encounters:  10/30/14 257 lb (116.574 kg)  10/24/14 252 lb (114.306 kg)  08/22/14 258 lb (117.028 kg)    General: Appears his stated age, well developed, well nourished in NAD. Skin: Warm, dry and intact. No rashes, lesions or ulcerations noted. HEENT: Head: normal shape and size; Eyes: sclera white, no icterus, conjunctiva pink, PERRLA and EOMs intact; Ears: Tm's gray and intact, normal light reflex; Nose: mucosa pink and moist, septum midline; Throat/Mouth: Teeth present, mucosa pink and moist, no exudate, lesions or ulcerations noted.  Neck:  Neck supple, trachea midline. No masses, lumps or thyromegaly present.  Cardiovascular: Normal rate and  rhythm. S1,S2 noted.  No murmur, rubs or gallops noted. No JVD or BLE edema. No carotid bruits noted. Pulmonary/Chest: Normal effort and positive vesicular breath sounds. No respiratory distress. No wheezes, rales or ronchi noted.  Abdomen: Soft and nontender. Normal bowel sounds, no bruits noted. No distention or masses noted. Liver, spleen and kidneys non palpable. Musculoskeletal: Normal range of motion. Strength 5/5 BUE/BLE. No difficulty with gait.  Neurological: Alert  and oriented. Cranial nerves II-XII grossly intact. Coordination normal.  Psychiatric: Mood and affect normal. Behavior is normal. Judgment and thought content normal.     BMET    Component Value Date/Time   NA 142 10/14/2013 1005   K 4.3 10/14/2013 1005   CL 107 10/14/2013 1005   CO2 21 10/14/2013 1005   GLUCOSE 86 10/14/2013 1005   BUN 12 10/14/2013 1005   CREATININE 1.1 10/14/2013 1005   CALCIUM 9.7 10/14/2013 1005    Lipid Panel     Component Value Date/Time   CHOL 218* 10/14/2013 1005   TRIG 131.0 10/14/2013 1005   HDL 47.20 10/14/2013 1005   CHOLHDL 5 10/14/2013 1005   VLDL 26.2 10/14/2013 1005    CBC    Component Value Date/Time   WBC 6.9 10/14/2013 1005   RBC 5.45 10/14/2013 1005   HGB 15.8 10/14/2013 1005   HCT 47.4 10/14/2013 1005   PLT 258.0 10/14/2013 1005   MCV 87.0 10/14/2013 1005   MCHC 33.3 10/14/2013 1005   RDW 13.9 10/14/2013 1005    Hgb A1C Lab Results  Component Value Date   HGBA1C 5.4 10/14/2013         Assessment & Plan:   Preventative Health Maintenance:  Encouraged him to continue to work on diet and exercise Will check CBC, CMET and Lipid today All HM UTD  RTC in 1 year or sooner if needed

## 2014-10-30 NOTE — Patient Instructions (Addendum)

## 2014-12-09 ENCOUNTER — Telehealth: Payer: Self-pay | Admitting: Internal Medicine

## 2014-12-09 DIAGNOSIS — F988 Other specified behavioral and emotional disorders with onset usually occurring in childhood and adolescence: Secondary | ICD-10-CM

## 2014-12-09 NOTE — Telephone Encounter (Signed)
Pt came in and requesting refill for vyvanse 50 mg

## 2014-12-09 NOTE — Telephone Encounter (Signed)
Pt dropped off some immunization forms to be updated. Once complete you can call him [phone] to pick up. I have placed the forms on your desk [place]. Thank you.

## 2014-12-12 MED ORDER — LISDEXAMFETAMINE DIMESYLATE 50 MG PO CAPS: 50.0000 mg | ORAL_CAPSULE | Freq: Every day | ORAL | Status: DC

## 2014-12-12 NOTE — Addendum Note (Signed)
Addended by: Roena MaladyEVONTENNO, MELANIE Y on: 12/12/2014 11:36 AM   Modules accepted: Orders

## 2014-12-12 NOTE — Telephone Encounter (Signed)
Rx left in front office for pick up and pt is aware  

## 2014-12-25 ENCOUNTER — Encounter: Payer: Self-pay | Admitting: Internal Medicine

## 2014-12-25 ENCOUNTER — Encounter: Payer: Self-pay | Admitting: *Deleted

## 2015-01-14 ENCOUNTER — Encounter: Payer: Self-pay | Admitting: Internal Medicine

## 2015-01-28 ENCOUNTER — Other Ambulatory Visit: Payer: Self-pay

## 2015-01-28 DIAGNOSIS — F988 Other specified behavioral and emotional disorders with onset usually occurring in childhood and adolescence: Secondary | ICD-10-CM

## 2015-01-28 MED ORDER — LISDEXAMFETAMINE DIMESYLATE 50 MG PO CAPS: 50.0000 mg | ORAL_CAPSULE | Freq: Every day | ORAL | Status: DC

## 2015-01-28 NOTE — Telephone Encounter (Signed)
Rx left in front office for pick up and pt is aware  

## 2015-01-28 NOTE — Telephone Encounter (Signed)
RX printed and signed and placed in MYD box 

## 2015-01-28 NOTE — Telephone Encounter (Signed)
Pt left v/m requesting rx vyvanse. Call when ready for pick up. Pt last seen 10/30/14.

## 2015-02-19 IMAGING — US US ABDOMEN COMPLETE
1 series · 14 of 25 positions shown · non-contrast
Comparison: None.

CLINICAL DATA: Elevated LFTs

EXAM:
ULTRASOUND ABDOMEN COMPLETE

[Series 1: us abdomen complete · 0.35mm/px · 14 of 74 slices shown]
[im 1/74]
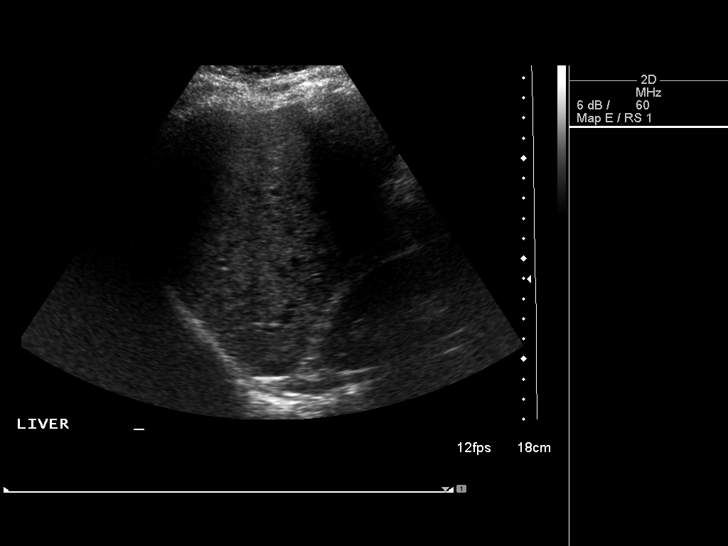
[im 7/74]
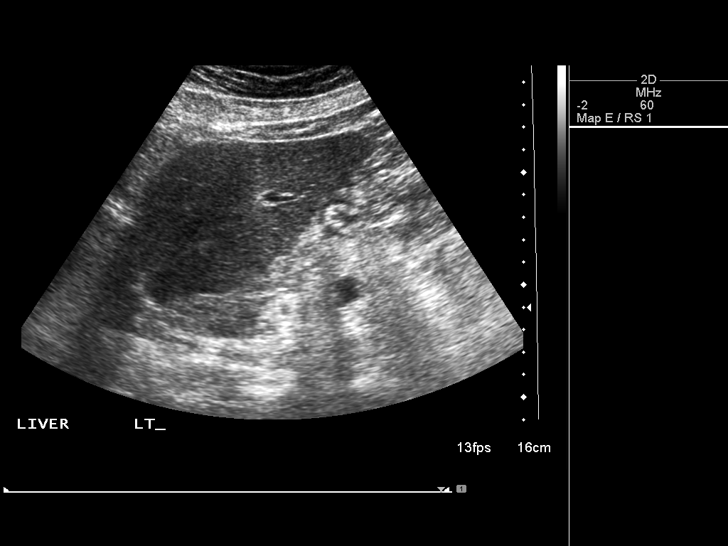
[im 13/74]
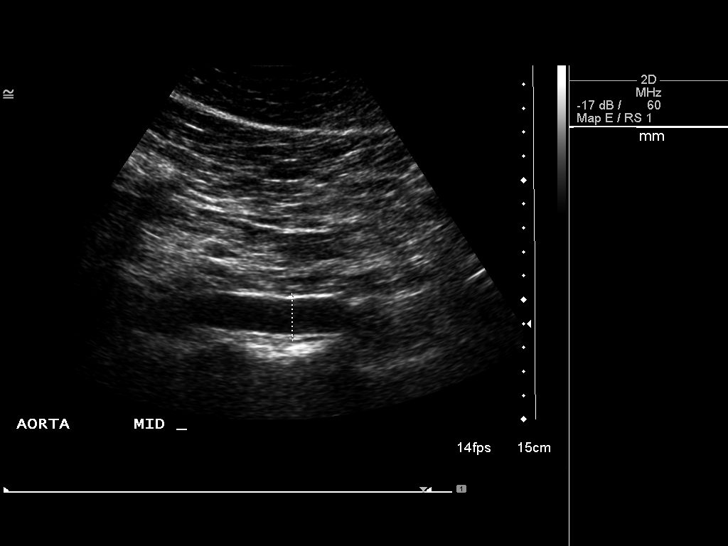
[im 19/74]
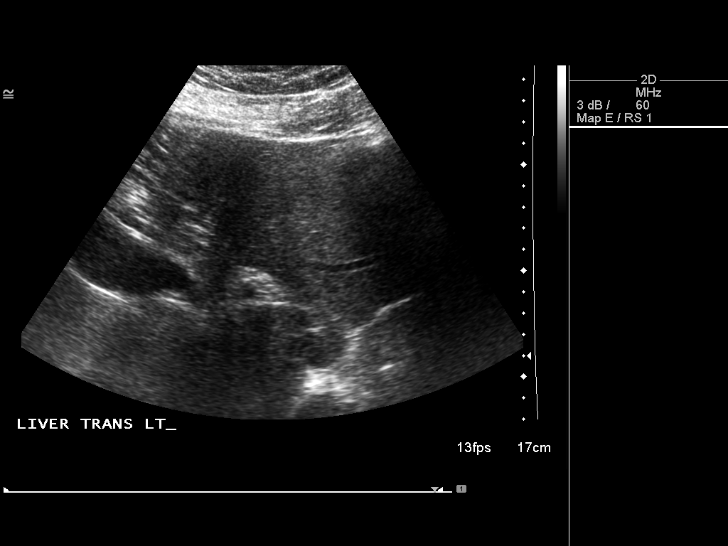
[im 25/74]
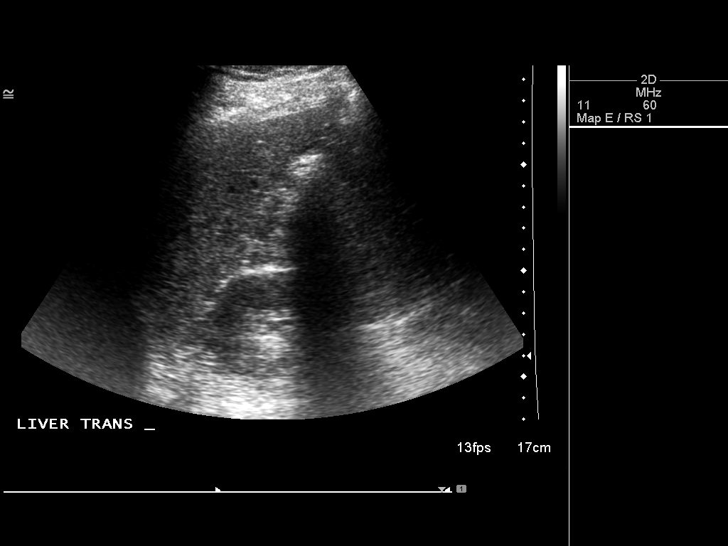
[im 28/74]
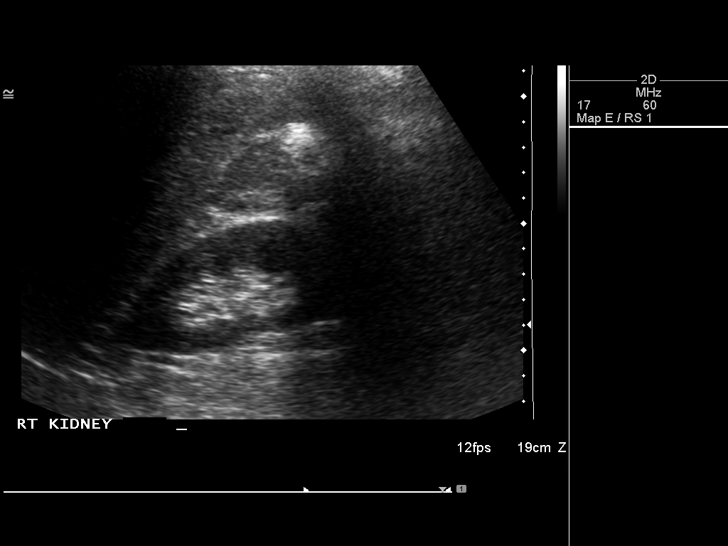
[im 34/74]
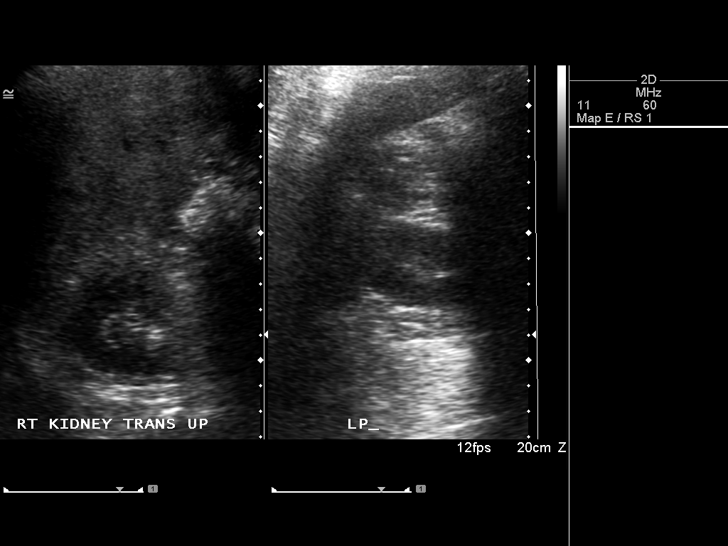
[im 40/74]
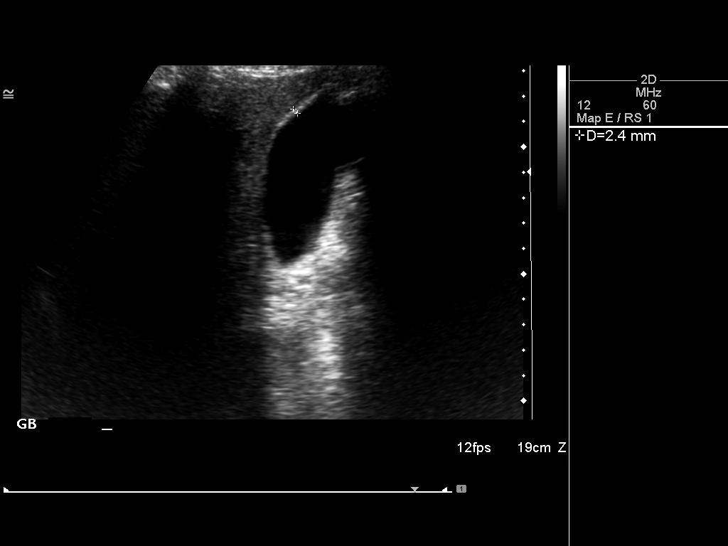
[im 46/74]
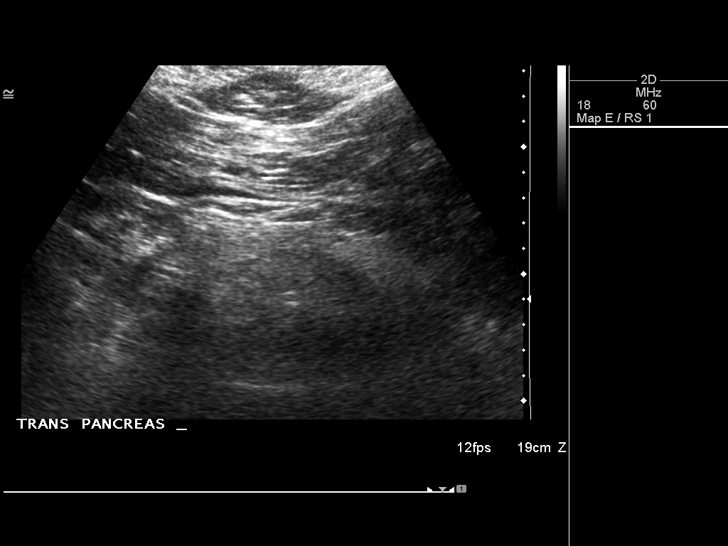
[im 49/74]
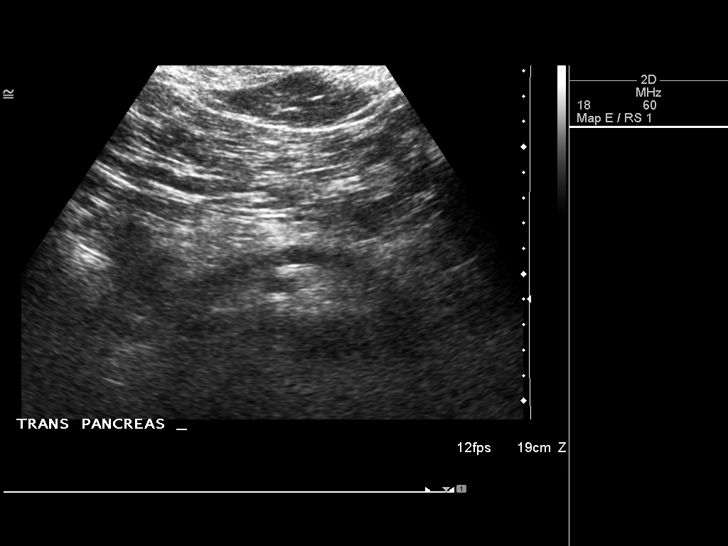
[im 55/74]
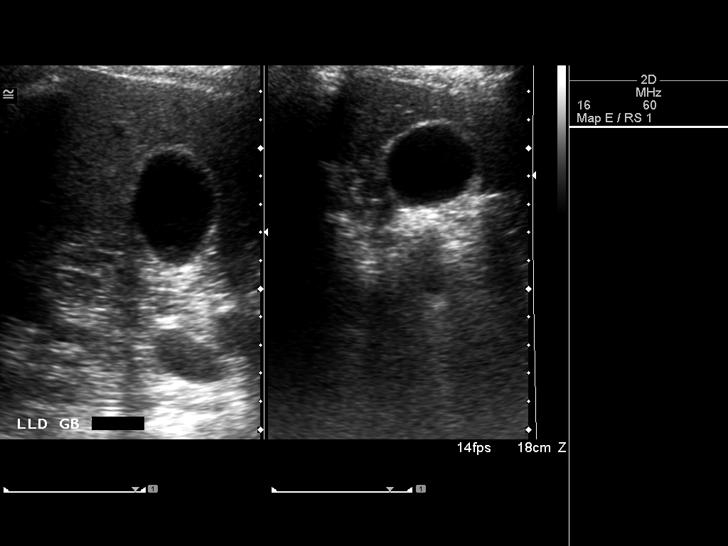
[im 61/74]
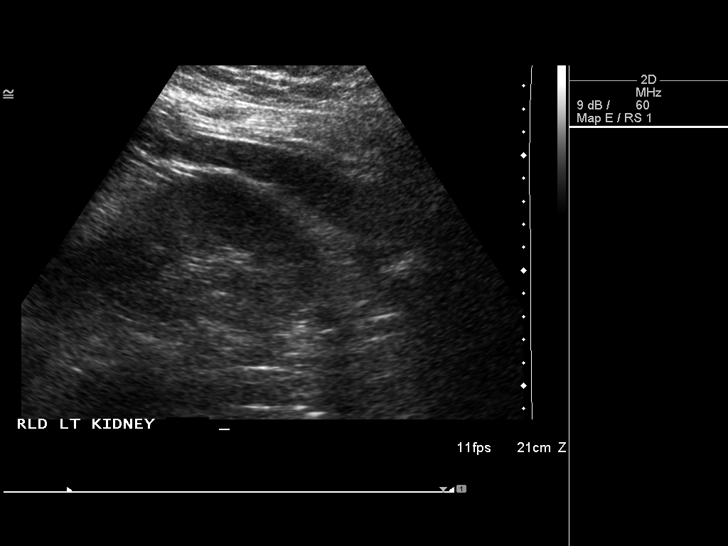
[im 67/74]
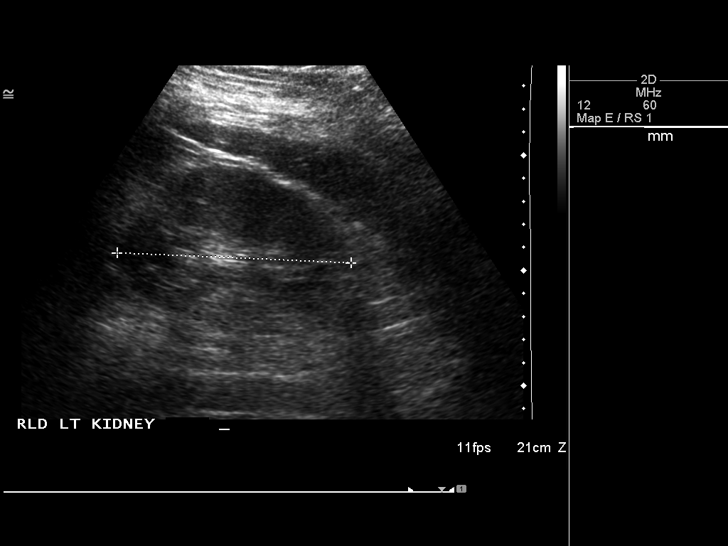
[im 74/74]
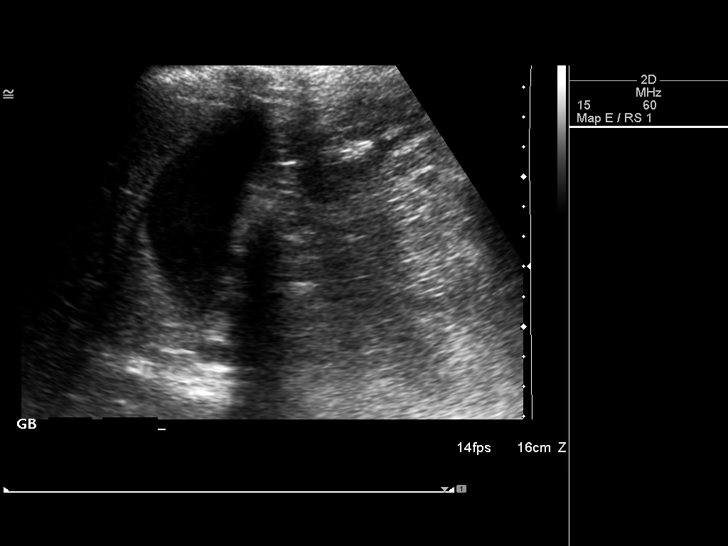

[14 of 25 positions shown; findings below may reference images not displayed]

FINDINGS: Gallbladder:

No gallstones, gallbladder wall thickening, or pericholecystic
fluid. Negative sonographic Murphy's sign.

Common bile duct:

Diameter: 4 mm.

Liver:

Hyperechoic hepatic parenchyma, raising the possibility of hepatic
steatosis. No focal hepatic lesion is seen.

IVC:

No abnormality visualized.

Pancreas:

Visualized portions are unremarkable.

Spleen:

Measures 7.6 cm.

Right Kidney:

Length: 11.2 cm.  No mass or hydronephrosis.

Left Kidney:

Length: 10.8 cm.  No mass or hydronephrosis.

Abdominal aorta:

No aneurysm visualized.

Other findings:

None.
IMPRESSION: Possible hepatic steatosis.

Otherwise negative abdominal ultrasound.

## 2015-03-12 ENCOUNTER — Other Ambulatory Visit: Payer: Self-pay | Admitting: *Deleted

## 2015-03-12 DIAGNOSIS — F988 Other specified behavioral and emotional disorders with onset usually occurring in childhood and adolescence: Secondary | ICD-10-CM

## 2015-03-12 MED ORDER — LISDEXAMFETAMINE DIMESYLATE 50 MG PO CAPS: 50.0000 mg | ORAL_CAPSULE | Freq: Every day | ORAL | Status: DC

## 2015-03-12 NOTE — Telephone Encounter (Signed)
Pt left voicemail at Triage requesting refill of Rx, pt request call back

## 2015-03-12 NOTE — Telephone Encounter (Signed)
RX printed and signed and placed in MYD box 

## 2015-03-12 NOTE — Telephone Encounter (Signed)
Rx left in front office for pick up and pt is aware  

## 2015-04-05 ENCOUNTER — Emergency Department
Admission: EM | Admit: 2015-04-05 | Discharge: 2015-04-05 | Disposition: A | Discharge status: Home / Self Care | Attending: Emergency Medicine | Admitting: Emergency Medicine

## 2015-04-05 ENCOUNTER — Encounter: Payer: Self-pay | Admitting: Emergency Medicine

## 2015-04-05 DIAGNOSIS — M791 Myalgia, unspecified site: Secondary | ICD-10-CM

## 2015-04-05 DIAGNOSIS — E860 Dehydration: Secondary | ICD-10-CM | POA: Diagnosis not present

## 2015-04-05 DIAGNOSIS — Z79899 Other long term (current) drug therapy: Secondary | ICD-10-CM | POA: Insufficient documentation

## 2015-04-05 DIAGNOSIS — Z720 Tobacco use: Secondary | ICD-10-CM | POA: Insufficient documentation

## 2015-04-05 LAB — URINALYSIS COMPLETE WITH MICROSCOPIC (ARMC ONLY)
BACTERIA UA: NONE SEEN
BILIRUBIN URINE: NEGATIVE
Glucose, UA: NEGATIVE mg/dL
HGB URINE DIPSTICK: NEGATIVE
Ketones, ur: NEGATIVE mg/dL
Leukocytes, UA: NEGATIVE
Nitrite: NEGATIVE
PROTEIN: NEGATIVE mg/dL
SPECIFIC GRAVITY, URINE: 1.005 (ref 1.005–1.030)
Squamous Epithelial / LPF: NONE SEEN
pH: 7 (ref 5.0–8.0)

## 2015-04-05 LAB — CBC WITH DIFFERENTIAL/PLATELET
Basophils Absolute: 0.1 10*3/uL (ref 0–0.1)
Basophils Relative: 1 %
EOS PCT: 1 %
Eosinophils Absolute: 0.1 10*3/uL (ref 0–0.7)
HCT: 47.5 % (ref 40.0–52.0)
HEMOGLOBIN: 15.8 g/dL (ref 13.0–18.0)
Lymphocytes Relative: 12 %
Lymphs Abs: 1.4 10*3/uL (ref 1.0–3.6)
MCH: 29.8 pg (ref 26.0–34.0)
MCHC: 33.2 g/dL (ref 32.0–36.0)
MCV: 89.7 fL (ref 80.0–100.0)
MONO ABS: 1.2 10*3/uL — AB (ref 0.2–1.0)
MONOS PCT: 10 %
Neutro Abs: 9.2 10*3/uL — ABNORMAL HIGH (ref 1.4–6.5)
Neutrophils Relative %: 76 %
Platelets: 190 10*3/uL (ref 150–440)
RBC: 5.3 MIL/uL (ref 4.40–5.90)
RDW: 13.4 % (ref 11.5–14.5)
WBC: 11.9 10*3/uL — AB (ref 3.8–10.6)

## 2015-04-05 LAB — CK: CK TOTAL: 115 U/L (ref 49–397)

## 2015-04-05 LAB — COMPREHENSIVE METABOLIC PANEL
ALK PHOS: 83 U/L (ref 38–126)
ALT: 31 U/L (ref 17–63)
ANION GAP: 13 (ref 5–15)
AST: 32 U/L (ref 15–41)
Albumin: 4.5 g/dL (ref 3.5–5.0)
BUN: 13 mg/dL (ref 6–20)
CO2: 17 mmol/L — ABNORMAL LOW (ref 22–32)
Calcium: 9.2 mg/dL (ref 8.9–10.3)
Chloride: 106 mmol/L (ref 101–111)
Creatinine, Ser: 1.28 mg/dL — ABNORMAL HIGH (ref 0.61–1.24)
GFR calc Af Amer: >60 mL/min (ref >60)
GLUCOSE: 108 mg/dL — AB (ref 65–99)
Potassium: 4.1 mmol/L (ref 3.5–5.1)
SODIUM: 136 mmol/L (ref 135–145)
TOTAL PROTEIN: 7.6 g/dL (ref 6.5–8.1)
Total Bilirubin: 0.8 mg/dL (ref 0.3–1.2)

## 2015-04-05 MED ORDER — SODIUM CHLORIDE 0.9 % IV BOLUS (SEPSIS)
1000.0000 mL | Freq: Once | INTRAVENOUS | Status: AC
  Administered 2015-04-05: 1000 mL via INTRAVENOUS

## 2015-04-05 NOTE — ED Notes (Signed)
Patient presents to the ED with generalized body aches since last night after working out heavily.  Patient noted that his urine appeared very dark and concentrated this morning.  Patient states he takes vyvance and was recently learned that it can cause muscle issues.  Patient states, "I just don't feel right, I think something is wrong with my body chemistry."  Patient was ambulatory to triage without obvious difficulty.  Patient is alert and oriented x 4 at this time.

## 2015-04-05 NOTE — Discharge Instructions (Signed)
Dehydration, Adult °Dehydration is when you lose more fluids from the body than you take in. Vital organs like the kidneys, brain, and heart cannot function without a proper amount of fluids and salt. Any loss of fluids from the body can cause dehydration.  °CAUSES  °· Vomiting. °· Diarrhea. °· Excessive sweating. °· Excessive urine output. °· Fever. °SYMPTOMS  °Mild dehydration °· Thirst. °· Dry lips. °· Slightly dry mouth. °Moderate dehydration °· Very dry mouth. °· Sunken eyes. °· Skin does not bounce back quickly when lightly pinched and released. °· Dark urine and decreased urine production. °· Decreased tear production. °· Headache. °Severe dehydration °· Very dry mouth. °· Extreme thirst. °· Rapid, weak pulse (more than 100 beats per minute at rest). °· Cold hands and feet. °· Not able to sweat in spite of heat and temperature. °· Rapid breathing. °· Blue lips. °· Confusion and lethargy. °· Difficulty being awakened. °· Minimal urine production. °· No tears. °DIAGNOSIS  °Your caregiver will diagnose dehydration based on your symptoms and your exam. Blood and urine tests will help confirm the diagnosis. The diagnostic evaluation should also identify the cause of dehydration. °TREATMENT  °Treatment of mild or moderate dehydration can often be done at home by increasing the amount of fluids that you drink. It is best to drink small amounts of fluid more often. Drinking too much at one time can make vomiting worse. Refer to the home care instructions below. °Severe dehydration needs to be treated at the hospital where you will probably be given intravenous (IV) fluids that contain water and electrolytes. °HOME CARE INSTRUCTIONS  °· Ask your caregiver about specific rehydration instructions. °· Drink enough fluids to keep your urine clear or pale yellow. °· Drink small amounts frequently if you have nausea and vomiting. °· Eat as you normally do. °· Avoid: °¨ Foods or drinks high in sugar. °¨ Carbonated  drinks. °¨ Juice. °¨ Extremely hot or cold fluids. °¨ Drinks with caffeine. °¨ Fatty, greasy foods. °¨ Alcohol. °¨ Tobacco. °¨ Overeating. °¨ Gelatin desserts. °· Wash your hands well to avoid spreading bacteria and viruses. °· Only take over-the-counter or prescription medicines for pain, discomfort, or fever as directed by your caregiver. °· Ask your caregiver if you should continue all prescribed and over-the-counter medicines. °· Keep all follow-up appointments with your caregiver. °SEEK MEDICAL CARE IF: °· You have abdominal pain and it increases or stays in one area (localizes). °· You have a rash, stiff neck, or severe headache. °· You are irritable, sleepy, or difficult to awaken. °· You are weak, dizzy, or extremely thirsty. °SEEK IMMEDIATE MEDICAL CARE IF:  °· You are unable to keep fluids down or you get worse despite treatment. °· You have frequent episodes of vomiting or diarrhea. °· You have blood or green matter (bile) in your vomit. °· You have blood in your stool or your stool looks black and tarry. °· You have not urinated in 6 to 8 hours, or you have only urinated a small amount of very dark urine. °· You have a fever. °· You faint. °MAKE SURE YOU:  °· Understand these instructions. °· Will watch your condition. °· Will get help right away if you are not doing well or get worse. °Document Released: 10/03/2005 Document Revised: 12/26/2011 Document Reviewed: 05/23/2011 °Exit[employer]® Patient Information ©2015 [employer]. This information is not intended to replace advice given to you by your health care provider. Make sure you discuss any questions you have with your health care   provider.    As we have discussed your labs are consistent with dehydration today. Please drink plenty of fluids, and avoid heavy physical activity for the next 2-3 days. Return to the emergency department for any personally concerning symptoms.

## 2015-04-05 NOTE — ED Provider Notes (Signed)
[facility] Emergency Department Provider Note  Time seen: 12:47 PM  I have reviewed the triage vital signs and the nursing notes.   HISTORY  Chief Complaint Generalized Body Aches    HPI Marco Allen is a 34 y.o. male with a past medical history of ADD presents the emergency department with generalized body aches and fatigue. 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 He states prior to 10 days ago he worked out rarely. He denies any dark urine, or bloody appearing urine. He states all his muscles are aching, and he feels generally fatigued. Denies fever, denies nausea/vomiting, abdominal pain, or chest pain. Describes his fatigue as moderate, and muscle aches as moderate.    Past Medical History  Diagnosis Date  . Chicken pox     Patient Active Problem List   Diagnosis Date Noted  . Fatty liver 10/24/2014  . ADD (attention deficit disorder) 08/22/2014  . Obesity (BMI 30-39.9) 10/14/2013    Past Surgical History  Procedure Laterality Date  . Arm skin lesion biopsy / excision Right 1999    cyst/lump removal    Current Outpatient Rx  Name  Route  Sig  Dispense  Refill  . lisdexamfetamine (VYVANSE) 50 MG capsule   Oral   Take 1 capsule (50 mg total) by mouth daily.   30 capsule   0     Allergies Review of patient's allergies indicates no known allergies.  Family History  Problem Relation Age of Onset  . Diabetes Paternal Uncle   . Cancer Maternal Grandmother   . Heart disease Maternal Grandfather   . Heart disease Paternal Grandfather     Social History History  Substance Use Topics  . Smoking status: Light Tobacco Smoker  . Smokeless tobacco: Never Used     Comment: cigars 10-12 a year  . Alcohol Use: 4.2 oz/week    7 Shots of liquor per week     Comment: occasional    Review of Systems Constitutional: Negative for fever. Cardiovascular:  Negative for chest pain. Respiratory: Negative for shortness of breath. Gastrointestinal: Negative for abdominal pain, vomiting and diarrhea. Genitourinary: Negative for dysuria. No Dark urine.  Musculoskeletal: Generalized muscle aches.  10-point ROS otherwise negative.  ____________________________________________   PHYSICAL EXAM:  VITAL SIGNS: ED Triage Vitals  Enc Vitals Group     BP 04/05/15 1055 141/94 mmHg     Pulse Rate 04/05/15 1055 117     Resp 04/05/15 1055 20     Temp 04/05/15 1055 101 F (38.3 C)     Temp Source 04/05/15 1055 Oral     SpO2 04/05/15 1055 100 %     Weight 04/05/15 1055 245 lb (111.131 kg)     Height 04/05/15 1055 6' 2.5" (1.892 m)     Head Cir --      Peak Flow --      Pain Score 04/05/15 1057 5     Pain Loc --      Pain Edu? --      Excl. in GC? --     Constitutional: Alert and oriented. Well appearing and in no distress. Eyes: Normal exam ENT   Mouth/Throat: Mucous membranes are moist. Cardiovascular: Normal rate, regular rhythm. No murmur Respiratory: Normal respiratory effort without tachypnea nor retractions. Breath sounds are clear Gastrointestinal: Soft and nontender. No distention.  Musculoskeletal: Normal range of motion in all extremities. Atraumatic  appearing. Patient states tenderness to palpation nearly every muscle group. Neurologic:  Normal speech and language. No gross focal neurologic deficits  Skin:  Skin is warm, dry and intact.  Psychiatric: Mood and affect are normal. Speech and behavior are normal.  ____________________________________________    INITIAL IMPRESSION / ASSESSMENT AND PLAN / ED COURSE  Pertinent labs & imaging results that were available during my care of the patient were reviewed by me and considered in my medical decision making (see chart for details).  Patient with generalized muscle aches, and fatigued. We will check labs including CK to rule out rhabdomyolysis. We will IV hydrate while  awaiting lab results. Overall patient appears quite well, nontoxic.  ----------------------------------------- 2:35 PM on 04/05/2015 -----------------------------------------  Labs show a creatinine elevation of 1.2, with an anion gap of 13, but a normal CK. Labs are most consistent with a mild to moderate amount of dehydration. The patient has received 1 L of normal saline IV, he is feeling better. I discussed with the patient increasing by mouth fluids at home. The patient is agreeable to this plan and we will discharge home.  ____________________________________________   FINAL CLINICAL IMPRESSION(S) / ED DIAGNOSES  Dehydration Muscle aches   Minna Antis, MD 04/05/15 1436

## 2015-04-05 NOTE — ED Notes (Signed)
D/c instructions reviewed w/ pt - pt denies any further questions or concerns at present.   

## 2015-04-28 ENCOUNTER — Other Ambulatory Visit: Payer: Self-pay

## 2015-04-28 DIAGNOSIS — F988 Other specified behavioral and emotional disorders with onset usually occurring in childhood and adolescence: Secondary | ICD-10-CM

## 2015-04-28 MED ORDER — LISDEXAMFETAMINE DIMESYLATE 50 MG PO CAPS: 50.0000 mg | ORAL_CAPSULE | Freq: Every day | ORAL | Status: DC

## 2015-04-28 NOTE — Telephone Encounter (Signed)
Pt left v/m requesting rx vyvanse. Call when ready for pick up. Pt last seen 10/30/14 and rx last printed # 30 on 03/12/15.

## 2015-04-28 NOTE — Telephone Encounter (Signed)
RX printed and signed and placed in MYD box 

## 2015-04-28 NOTE — Telephone Encounter (Signed)
Rx left in front office for pick up and pt is aware  

## 2015-06-24 ENCOUNTER — Other Ambulatory Visit: Payer: Self-pay

## 2015-06-24 DIAGNOSIS — F988 Other specified behavioral and emotional disorders with onset usually occurring in childhood and adolescence: Secondary | ICD-10-CM

## 2015-06-24 MED ORDER — LISDEXAMFETAMINE DIMESYLATE 50 MG PO CAPS: 50.0000 mg | ORAL_CAPSULE | Freq: Every day | ORAL | Status: DC

## 2015-06-24 NOTE — Telephone Encounter (Signed)
Pt left vm requesting rx vyvanse. Call when ready for pick up.rx last printed # 30 on 04/28/15 and last annual exam on 10/30/14.

## 2015-06-24 NOTE — Telephone Encounter (Signed)
Rx left in front office for pick up and pt is aware  

## 2015-06-24 NOTE — Telephone Encounter (Signed)
RX printed and signed and placed in MYD box 

## 2015-07-27 ENCOUNTER — Telehealth: Payer: Self-pay | Admitting: Internal Medicine

## 2015-07-27 ENCOUNTER — Other Ambulatory Visit: Payer: Self-pay | Admitting: Internal Medicine

## 2015-07-27 DIAGNOSIS — F988 Other specified behavioral and emotional disorders with onset usually occurring in childhood and adolescence: Secondary | ICD-10-CM

## 2015-07-27 MED ORDER — LISDEXAMFETAMINE DIMESYLATE 50 MG PO CAPS: 50.0000 mg | ORAL_CAPSULE | Freq: Every day | ORAL | Status: DC

## 2015-07-27 NOTE — Telephone Encounter (Signed)
Pt left vm requesting rx vyvanse. Call when ready for pick up.rx last printed # 30 on 06/24/15 and last annual exam on 10/30/14.           Best number to call pt is [phone]

## 2015-07-27 NOTE — Telephone Encounter (Signed)
RX printed and signed and placed in MYD box 

## 2015-07-27 NOTE — Telephone Encounter (Signed)
Rx left in front office for pick up and pt is aware  

## 2015-08-04 ENCOUNTER — Emergency Department
Admission: EM | Admit: 2015-08-04 | Discharge: 2015-08-04 | Disposition: A | Discharge status: Home / Self Care | Payer: No Typology Code available for payment source | Attending: Emergency Medicine | Admitting: Emergency Medicine

## 2015-08-04 ENCOUNTER — Emergency Department: Payer: No Typology Code available for payment source

## 2015-08-04 ENCOUNTER — Encounter: Payer: Self-pay | Admitting: *Deleted

## 2015-08-04 DIAGNOSIS — Z79899 Other long term (current) drug therapy: Secondary | ICD-10-CM | POA: Insufficient documentation

## 2015-08-04 DIAGNOSIS — Y998 Other external cause status: Secondary | ICD-10-CM | POA: Diagnosis not present

## 2015-08-04 DIAGNOSIS — Y9389 Activity, other specified: Secondary | ICD-10-CM | POA: Diagnosis not present

## 2015-08-04 DIAGNOSIS — S0083XA Contusion of other part of head, initial encounter: Secondary | ICD-10-CM | POA: Insufficient documentation

## 2015-08-04 DIAGNOSIS — Z720 Tobacco use: Secondary | ICD-10-CM | POA: Insufficient documentation

## 2015-08-04 DIAGNOSIS — S6992XA Unspecified injury of left wrist, hand and finger(s), initial encounter: Secondary | ICD-10-CM | POA: Insufficient documentation

## 2015-08-04 DIAGNOSIS — Y92410 Unspecified street and highway as the place of occurrence of the external cause: Secondary | ICD-10-CM | POA: Insufficient documentation

## 2015-08-04 DIAGNOSIS — R52 Pain, unspecified: Secondary | ICD-10-CM

## 2015-08-04 DIAGNOSIS — S199XXA Unspecified injury of neck, initial encounter: Secondary | ICD-10-CM | POA: Insufficient documentation

## 2015-08-04 MED ORDER — IBUPROFEN 800 MG PO TABS: 800.0000 mg | ORAL_TABLET | Freq: Three times a day (TID) | ORAL | Status: DC | PRN

## 2015-08-04 MED ORDER — CYCLOBENZAPRINE HCL 10 MG PO TABS: 10.0000 mg | ORAL_TABLET | Freq: Three times a day (TID) | ORAL | Status: DC | PRN
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
## 2015-08-04 NOTE — Discharge Instructions (Signed)

## 2015-09-11 ENCOUNTER — Other Ambulatory Visit: Payer: Self-pay | Admitting: Internal Medicine

## 2015-09-11 DIAGNOSIS — F988 Other specified behavioral and emotional disorders with onset usually occurring in childhood and adolescence: Secondary | ICD-10-CM

## 2015-09-12 NOTE — Telephone Encounter (Signed)
Last office visit 10/30/2014.  Last refilled 07/27/2015 for #30 with no refills.  Ok to refill?

## 2015-09-14 MED ORDER — LISDEXAMFETAMINE DIMESYLATE 50 MG PO CAPS: 50.0000 mg | ORAL_CAPSULE | Freq: Every day | ORAL | Status: DC

## 2015-09-14 NOTE — Telephone Encounter (Signed)
RX printed and signed and placed in MYD box 

## 2015-09-14 NOTE — Telephone Encounter (Signed)
lmovm Rx placed in front office for pick up

## 2015-10-20 ENCOUNTER — Other Ambulatory Visit: Payer: Self-pay

## 2015-10-20 DIAGNOSIS — F988 Other specified behavioral and emotional disorders with onset usually occurring in childhood and adolescence: Secondary | ICD-10-CM

## 2015-10-20 NOTE — Telephone Encounter (Signed)
Pt left vm requesting rx vyvanse. Call when ready for pick up. rx last printed #30 on 09/14/15. Last seen annual 10/30/14. No future appt scheduled.

## 2015-10-21 MED ORDER — LISDEXAMFETAMINE DIMESYLATE 50 MG PO CAPS: 50.0000 mg | ORAL_CAPSULE | Freq: Every day | ORAL | Status: DC

## 2015-10-21 NOTE — Telephone Encounter (Signed)
RX printed and signed and placed in MYD box. He needs to make a follow up/annual exam

## 2015-10-21 NOTE — Telephone Encounter (Signed)
Rx left in front office for pick up and lmovm

## 2015-11-23 ENCOUNTER — Other Ambulatory Visit: Payer: Self-pay

## 2015-11-23 DIAGNOSIS — F988 Other specified behavioral and emotional disorders with onset usually occurring in childhood and adolescence: Secondary | ICD-10-CM

## 2015-11-23 MED ORDER — LISDEXAMFETAMINE DIMESYLATE 50 MG PO CAPS: 50.0000 mg | ORAL_CAPSULE | Freq: Every day | ORAL | Status: DC

## 2015-11-23 NOTE — Telephone Encounter (Signed)
RX printed and signed and placed in MYD box 

## 2015-11-23 NOTE — Telephone Encounter (Signed)
Pt left v/m requesting rx vyvanse. Call when ready for pick up. Last printed # 30 on 10/21/15; last seen 10/30/2014. And CPX scheduled on 12/07/15.

## 2015-11-23 NOTE — Telephone Encounter (Signed)
Left message on voicemail Rx placed in front office for pick up 

## 2015-12-02 ENCOUNTER — Encounter: Admitting: Internal Medicine

## 2015-12-07 ENCOUNTER — Encounter: Payer: Self-pay | Admitting: Internal Medicine

## 2015-12-07 ENCOUNTER — Ambulatory Visit (INDEPENDENT_AMBULATORY_CARE_PROVIDER_SITE_OTHER): Admitting: Internal Medicine

## 2015-12-07 VITALS — BP 132/86 | HR 78 | Ht 74.25 in | Wt 240.0 lb

## 2015-12-07 DIAGNOSIS — F909 Attention-deficit hyperactivity disorder, unspecified type: Secondary | ICD-10-CM

## 2015-12-07 DIAGNOSIS — K760 Fatty (change of) liver, not elsewhere classified: Secondary | ICD-10-CM | POA: Diagnosis not present

## 2015-12-07 DIAGNOSIS — N522 Drug-induced erectile dysfunction: Secondary | ICD-10-CM | POA: Diagnosis not present

## 2015-12-07 DIAGNOSIS — Z0000 Encounter for general adult medical examination without abnormal findings: Secondary | ICD-10-CM | POA: Diagnosis not present

## 2015-12-07 DIAGNOSIS — F988 Other specified behavioral and emotional disorders with onset usually occurring in childhood and adolescence: Secondary | ICD-10-CM

## 2015-12-07 MED ORDER — AMPHETAMINE-DEXTROAMPHETAMINE 10 MG PO TABS: 10.0000 mg | ORAL_TABLET | Freq: Every day | ORAL | Status: DC | PRN

## 2015-12-07 MED ORDER — TADALAFIL 10 MG PO TABS: 10.0000 mg | ORAL_TABLET | Freq: Every day | ORAL | Status: DC | PRN

## 2015-12-07 NOTE — Addendum Note (Signed)
Addended by: Lorre Munroe on: 12/07/2015 04:14 PM   Modules accepted: Orders, SmartSet

## 2015-12-07 NOTE — Assessment & Plan Note (Signed)
Will try Cialis Coupon given and RX sent to pharmacy

## 2015-12-07 NOTE — Assessment & Plan Note (Signed)
Wearing off in the evening Continue Vyvanse during the day Will give RX for Adderall 10 mg (break in half) on days you have late classes

## 2015-12-07 NOTE — Progress Notes (Signed)
Subjective:    Patient ID: Marco Allen, male    DOB: [birth year], 35 y.o.   MRN: [id number]  HPI  Pt presents to the clinic today for his physical exam.   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  ADD: His symptoms are well controlled on Vyvanse. He does occasionally struggle when he has night classes and feels like the Vyvanse wears off. He is wondering if there is short acting medication that would be effective for a short duration.  Fatty liver: Ultrasound from 10/2013 reviewed. He has lost 17 lbs since his last visit.   Review of Systems      Past Medical History  Diagnosis Date  . Chicken pox     Current Outpatient Prescriptions  Medication Sig Dispense Refill  . lisdexamfetamine (VYVANSE) 50 MG capsule Take 1 capsule (50 mg total) by mouth daily. 30 capsule 0   No current facility-administered medications for this visit.    No Known Allergies  Family History  Problem Relation Age of Onset  . Diabetes Paternal Uncle   . Cancer Maternal Grandmother   . Heart disease Maternal Grandfather   . Heart disease Paternal Grandfather     Social History   Social History  . Marital Status: Married    Spouse Name: N/A  . Number of Children: N/A  . Years of Education: N/A   Occupational History  . Not on file.   Social History Main Topics  . Smoking status: Light Tobacco Smoker  . Smokeless tobacco: Never Used     Comment: cigars 10-12 a year  . Alcohol Use: 4.2 oz/week    7 Shots of liquor per week     Comment: occasional  . Drug Use: No  . Sexual Activity: Yes   Other Topics Concern  . Not on file   Social History Narrative     Constitutional: Denies fever, malaise, fatigue, headache or abrupt weight changes.  HEENT: Denies eye pain, eye redness, ear pain, ringing in the ears, wax buildup, runny nose, nasal  congestion, bloody nose, or sore throat. Respiratory: Denies difficulty breathing, shortness of breath, cough or sputum production.   Cardiovascular: Denies chest pain, chest tightness, palpitations or swelling in the hands or feet.  Gastrointestinal: Denies abdominal pain, bloating, constipation, diarrhea or blood in the stool.  GU: Pt reports erectile dysfunction. Denies urgency, frequency, pain with urination, burning sensation, blood in urine, odor or discharge. Musculoskeletal: Denies decrease in range of motion, difficulty with gait, muscle pain or joint pain and swelling.  Skin: Denies redness, rashes, lesions or ulcercations.  Neurological: Denies dizziness, difficulty with memory, difficulty with speech or problems with balance and coordination.  Psych: Denies anxiety, depression, SI/HI.  No other specific complaints in a complete review of systems (except as listed in HPI above).  Objective:   Physical Exam  Ht 6' 2.25" (1.886 m)  Wt 240 lb (108.863 kg)  BMI 30.61 kg/m2 Wt Readings from Last 3 Encounters:  12/07/15 240 lb (108.863 kg)  08/04/15 233 lb (105.688 kg)  04/05/15 245 lb (111.131 kg)    General: Appears his stated age, well developed, well nourished in NAD. Skin: Warm, dry and intact.  HEENT: Head: normal shape and size; Eyes: sclera white, no icterus, conjunctiva pink, PERRLA and EOMs intact; Ears: Tm's gray and intact, normal light reflex; Throat/Mouth: Teeth present, mucosa pink and moist, no exudate,  lesions or ulcerations noted.  Neck:  Neck supple, trachea midline. No masses, lumps or thyromegaly present.  Cardiovascular: Normal rate and rhythm. S1,S2 noted.  No murmur, rubs or gallops noted. ed. Pulmonary/Chest: Normal effort and positive vesicular breath sounds. No respiratory distress. No wheezes, rales or ronchi noted.  Abdomen: Soft and nontender. Normal bowel sound. No distention or masses noted. Liver, spleen and kidneys non palpable. Musculoskeletal:  Normal range of motion. Strength 5/5 BUE/BLE. No difficulty with gait.  Neurological: Alert and oriented. Cranial nerves II-XII grossly intact. Coordination normal.  Psychiatric: Mood and affect normal. Behavior is normal. Judgment and thought content normal.     BMET    Component Value Date/Time   NA 136 04/05/2015 1113   K 4.1 04/05/2015 1113   CL 106 04/05/2015 1113   CO2 17* 04/05/2015 1113   GLUCOSE 108* 04/05/2015 1113   BUN 13 04/05/2015 1113   CREATININE 1.28* 04/05/2015 1113   CALCIUM 9.2 04/05/2015 1113   GFRNONAA >60 04/05/2015 1113   GFRAA >60 04/05/2015 1113    Lipid Panel     Component Value Date/Time   CHOL 174 10/30/2014 1500   TRIG 187.0* 10/30/2014 1500   HDL 45.80 10/30/2014 1500   CHOLHDL 4 10/30/2014 1500   VLDL 37.4 10/30/2014 1500   LDLCALC 91 10/30/2014 1500    CBC    Component Value Date/Time   WBC 11.9* 04/05/2015 1113   RBC 5.30 04/05/2015 1113   HGB 15.8 04/05/2015 1113   HCT 47.5 04/05/2015 1113   PLT 190 04/05/2015 1113   MCV 89.7 04/05/2015 1113   MCH 29.8 04/05/2015 1113   MCHC 33.2 04/05/2015 1113   RDW 13.4 04/05/2015 1113   LYMPHSABS 1.4 04/05/2015 1113   MONOABS 1.2* 04/05/2015 1113   EOSABS 0.1 04/05/2015 1113   BASOSABS 0.1 04/05/2015 1113    Hgb A1C Lab Results  Component Value Date   HGBA1C 5.4 10/14/2013         Assessment & Plan:   Preventative Health Maintenance:  Flu and Tetanus UTD Encouraged him to continue to work on diet and exercise Will check CBC, CMET, Lipid and A1C today Encouraged him to see a dentist biannually   RTC in 1 year or sooner if needed

## 2015-12-07 NOTE — Assessment & Plan Note (Signed)
Will check CMET today 

## 2015-12-07 NOTE — Patient Instructions (Signed)

## 2015-12-07 NOTE — Progress Notes (Signed)
Pre visit review using our clinic review tool, if applicable. No additional management support is needed unless otherwise documented below in the visit note. 

## 2015-12-08 ENCOUNTER — Telehealth: Payer: Self-pay | Admitting: Internal Medicine

## 2015-12-08 DIAGNOSIS — N522 Drug-induced erectile dysfunction: Secondary | ICD-10-CM

## 2015-12-08 LAB — COMPREHENSIVE METABOLIC PANEL
ALT: 23 U/L (ref 0–53)
AST: 22 U/L (ref 0–37)
Albumin: 4.6 g/dL (ref 3.5–5.2)
Alkaline Phosphatase: 71 U/L (ref 39–117)
BUN: 18 mg/dL (ref 6–23)
CHLORIDE: 102 meq/L (ref 96–112)
CO2: 27 meq/L (ref 19–32)
CREATININE: 1.18 mg/dL (ref 0.40–1.50)
Calcium: 9.6 mg/dL (ref 8.4–10.5)
GFR: 74.77 mL/min (ref >60.00)
Glucose, Bld: 86 mg/dL (ref 70–99)
Potassium: 4.1 mEq/L (ref 3.5–5.1)
SODIUM: 136 meq/L (ref 135–145)
Total Bilirubin: 0.6 mg/dL (ref 0.2–1.2)
Total Protein: 7.4 g/dL (ref 6.0–8.3)

## 2015-12-08 LAB — CBC
HCT: 45.9 % (ref 39.0–52.0)
Hemoglobin: 15.1 g/dL (ref 13.0–17.0)
MCHC: 32.9 g/dL (ref 30.0–36.0)
MCV: 89 fl (ref 78.0–100.0)
Platelets: 298 10*3/uL (ref 150.0–400.0)
RBC: 5.16 Mil/uL (ref 4.22–5.81)
RDW: 13.5 % (ref 11.5–15.5)
WBC: 9.6 10*3/uL (ref 4.0–10.5)

## 2015-12-08 LAB — LIPID PANEL
CHOL/HDL RATIO: 4
Cholesterol: 188 mg/dL (ref 0–200)
HDL: 53.1 mg/dL (ref >39.00)
LDL CALC: 116 mg/dL — AB (ref 0–99)
NONHDL: 135.21
Triglycerides: 97 mg/dL (ref 0.0–149.0)
VLDL: 19.4 mg/dL (ref 0.0–40.0)

## 2015-12-08 LAB — HEMOGLOBIN A1C: HEMOGLOBIN A1C: 5.3 % (ref 4.6–6.5)

## 2015-12-08 MED ORDER — SILDENAFIL CITRATE 25 MG PO TABS: 25.0000 mg | ORAL_TABLET | Freq: Every day | ORAL | Status: DC | PRN

## 2015-12-08 MED ORDER — SILDENAFIL CITRATE 20 MG PO TABS: ORAL_TABLET | ORAL | Status: DC

## 2015-12-08 NOTE — Telephone Encounter (Signed)
Pt called requesting cb on status of prior auth; spoke with [facility] pharmacy and she ran the viagra 25 mg and it requires a prior auth; suggested sending sildenafil  as generic form; 2-5 tabs is usual viagra dose. Please advise.

## 2015-12-08 NOTE — Telephone Encounter (Signed)
Sildenafil 20 mg 2-5 tablets as needed for sexual activity #50 sent to pharmacy per verbal order from [facility]

## 2015-12-08 NOTE — Addendum Note (Signed)
Addended by: Roena Malady on: 12/08/2015 01:51 PM   Modules accepted: Orders, Medications

## 2015-12-08 NOTE — Telephone Encounter (Signed)
Patient called to verify that Marco Allen changed his Pharmacy to [place].  Please call patient.

## 2015-12-08 NOTE — Telephone Encounter (Signed)
Cialis not vovered per verbal order--Regina changed to Viagra 25---Rx sent to pharmacy--[facility]

## 2015-12-08 NOTE — Telephone Encounter (Signed)
That is fine with me.

## 2015-12-19 ENCOUNTER — Other Ambulatory Visit: Payer: Self-pay | Admitting: Internal Medicine

## 2015-12-19 ENCOUNTER — Encounter: Payer: Self-pay | Admitting: Internal Medicine

## 2015-12-19 DIAGNOSIS — F988 Other specified behavioral and emotional disorders with onset usually occurring in childhood and adolescence: Secondary | ICD-10-CM

## 2015-12-21 MED ORDER — LISDEXAMFETAMINE DIMESYLATE 50 MG PO CAPS: 50.0000 mg | ORAL_CAPSULE | Freq: Every day | ORAL | Status: DC

## 2015-12-21 NOTE — Telephone Encounter (Signed)
Rx left in front office for pick up and pt is aware  

## 2015-12-21 NOTE — Telephone Encounter (Signed)
Last filled 12/21/15--please advise

## 2015-12-21 NOTE — Telephone Encounter (Signed)
RX printed and signed and placed in MYD box 

## 2015-12-22 ENCOUNTER — Encounter: Payer: Self-pay | Admitting: Internal Medicine

## 2016-01-19 ENCOUNTER — Other Ambulatory Visit: Payer: Self-pay | Admitting: Internal Medicine

## 2016-01-19 DIAGNOSIS — F988 Other specified behavioral and emotional disorders with onset usually occurring in childhood and adolescence: Secondary | ICD-10-CM

## 2016-01-20 MED ORDER — LISDEXAMFETAMINE DIMESYLATE 50 MG PO CAPS: 50.0000 mg | ORAL_CAPSULE | Freq: Every day | ORAL | Status: DC

## 2016-01-20 MED ORDER — SILDENAFIL CITRATE 20 MG PO TABS: ORAL_TABLET | ORAL | Status: DC

## 2016-01-20 MED ORDER — AMPHETAMINE-DEXTROAMPHETAMINE 10 MG PO TABS: 10.0000 mg | ORAL_TABLET | Freq: Every day | ORAL | Status: DC | PRN

## 2016-01-20 NOTE — Telephone Encounter (Signed)
Rx left in front office for pick up and pt is aware  

## 2016-01-20 NOTE — Telephone Encounter (Signed)
RX printed and signed and placed in MYD box 

## 2016-01-20 NOTE — Telephone Encounter (Signed)
Last filled 12/21/2015--please advise

## 2016-02-19 ENCOUNTER — Other Ambulatory Visit: Payer: Self-pay | Admitting: Internal Medicine

## 2016-02-19 DIAGNOSIS — F988 Other specified behavioral and emotional disorders with onset usually occurring in childhood and adolescence: Secondary | ICD-10-CM

## 2016-02-19 NOTE — Telephone Encounter (Signed)
Last filled 01/20/16--please asvise

## 2016-02-22 MED ORDER — AMPHETAMINE-DEXTROAMPHETAMINE 10 MG PO TABS: 10.0000 mg | ORAL_TABLET | Freq: Every day | ORAL | Status: DC | PRN

## 2016-02-22 MED ORDER — SILDENAFIL CITRATE 20 MG PO TABS: ORAL_TABLET | ORAL | Status: DC

## 2016-02-22 MED ORDER — LISDEXAMFETAMINE DIMESYLATE 50 MG PO CAPS: 50.0000 mg | ORAL_CAPSULE | Freq: Every day | ORAL | Status: DC

## 2016-02-22 NOTE — Telephone Encounter (Signed)
Revatio sent electeronically. Vyvanse and Adderall RX printed and signed and placed in MYD box

## 2016-02-23 NOTE — Telephone Encounter (Signed)
Rx left in front office for pick up and pt is aware via mychart 

## 2016-03-31 ENCOUNTER — Other Ambulatory Visit: Payer: Self-pay | Admitting: Internal Medicine

## 2016-03-31 DIAGNOSIS — F988 Other specified behavioral and emotional disorders with onset usually occurring in childhood and adolescence: Secondary | ICD-10-CM

## 2016-03-31 DIAGNOSIS — N522 Drug-induced erectile dysfunction: Secondary | ICD-10-CM

## 2016-04-01 MED ORDER — SILDENAFIL CITRATE 20 MG PO TABS: ORAL_TABLET | ORAL | Status: DC

## 2016-04-01 MED ORDER — LISDEXAMFETAMINE DIMESYLATE 50 MG PO CAPS: 50.0000 mg | ORAL_CAPSULE | Freq: Every day | ORAL | Status: DC

## 2016-04-01 MED ORDER — AMPHETAMINE-DEXTROAMPHETAMINE 10 MG PO TABS: 10.0000 mg | ORAL_TABLET | Freq: Every day | ORAL | Status: DC | PRN

## 2016-04-01 NOTE — Telephone Encounter (Signed)
Rx left in front office for pick up and pt is aware  

## 2016-04-01 NOTE — Telephone Encounter (Signed)
Baity pt, pt is UTD on CPE and UDS--last filled 02/22/16--please advise

## 2016-05-10 ENCOUNTER — Other Ambulatory Visit: Payer: Self-pay | Admitting: Primary Care

## 2016-05-10 DIAGNOSIS — N522 Drug-induced erectile dysfunction: Secondary | ICD-10-CM

## 2016-05-10 MED ORDER — SILDENAFIL CITRATE 20 MG PO TABS: ORAL_TABLET | ORAL | 0 refills | Status: DC

## 2016-06-16 ENCOUNTER — Other Ambulatory Visit: Payer: Self-pay | Admitting: Primary Care

## 2016-06-16 DIAGNOSIS — N522 Drug-induced erectile dysfunction: Secondary | ICD-10-CM

## 2016-06-16 MED ORDER — SILDENAFIL CITRATE 20 MG PO TABS: ORAL_TABLET | ORAL | 0 refills | Status: DC

## 2016-07-26 ENCOUNTER — Other Ambulatory Visit: Payer: Self-pay | Admitting: Primary Care

## 2016-07-26 ENCOUNTER — Other Ambulatory Visit: Payer: Self-pay | Admitting: Internal Medicine

## 2016-07-26 DIAGNOSIS — N522 Drug-induced erectile dysfunction: Secondary | ICD-10-CM

## 2016-07-26 DIAGNOSIS — F988 Other specified behavioral and emotional disorders with onset usually occurring in childhood and adolescence: Secondary | ICD-10-CM

## 2016-07-26 NOTE — Telephone Encounter (Signed)
Last filled 04/01/2016--please advise--next UDS not due until 11/2016

## 2016-07-27 MED ORDER — SILDENAFIL CITRATE 20 MG PO TABS: ORAL_TABLET | ORAL | 0 refills | Status: DC

## 2016-08-06 ENCOUNTER — Other Ambulatory Visit: Payer: Self-pay | Admitting: Internal Medicine

## 2016-08-06 ENCOUNTER — Other Ambulatory Visit: Payer: Self-pay | Admitting: Primary Care

## 2016-08-06 DIAGNOSIS — F988 Other specified behavioral and emotional disorders with onset usually occurring in childhood and adolescence: Secondary | ICD-10-CM

## 2016-08-06 DIAGNOSIS — N522 Drug-induced erectile dysfunction: Secondary | ICD-10-CM

## 2016-08-11 MED ORDER — LISDEXAMFETAMINE DIMESYLATE 50 MG PO CAPS: 50.0000 mg | ORAL_CAPSULE | Freq: Every day | ORAL | 0 refills | Status: DC

## 2016-08-11 MED ORDER — AMPHETAMINE-DEXTROAMPHETAMINE 10 MG PO TABS: 10.0000 mg | ORAL_TABLET | Freq: Every day | ORAL | 0 refills | Status: DC | PRN

## 2016-08-11 NOTE — Telephone Encounter (Signed)
Pt wants to see if insurance would do 30 day supply at a time through mail order because it would save pt a lot of money---please advise if okay to fax to mail order for 30 day supply

## 2016-08-11 NOTE — Telephone Encounter (Signed)
Ok to fax to mail order. 

## 2016-08-11 NOTE — Telephone Encounter (Signed)
Place in your box to sign, please return to me

## 2016-08-12 MED ORDER — AMPHETAMINE-DEXTROAMPHETAMINE 10 MG PO TABS: 10.0000 mg | ORAL_TABLET | Freq: Every day | ORAL | 0 refills | Status: DC | PRN

## 2016-08-12 MED ORDER — LISDEXAMFETAMINE DIMESYLATE 50 MG PO CAPS: 50.0000 mg | ORAL_CAPSULE | Freq: Every day | ORAL | 0 refills | Status: DC

## 2016-08-24 ENCOUNTER — Other Ambulatory Visit: Payer: Self-pay

## 2016-08-24 DIAGNOSIS — N522 Drug-induced erectile dysfunction: Secondary | ICD-10-CM

## 2016-08-24 MED ORDER — SILDENAFIL CITRATE 20 MG PO TABS: ORAL_TABLET | ORAL | 0 refills | Status: DC

## 2016-08-24 NOTE — Telephone Encounter (Signed)
Last filled 07/27/2016--pt is requesting 90 day supply--please advise if okay to send in for 90 days

## 2016-08-24 NOTE — Telephone Encounter (Signed)
Ok to send in #[phone] for 90 day supply

## 2016-09-01 ENCOUNTER — Other Ambulatory Visit: Payer: Self-pay | Admitting: Internal Medicine

## 2016-09-01 ENCOUNTER — Other Ambulatory Visit: Payer: Self-pay

## 2016-09-01 MED ORDER — SILDENAFIL CITRATE 25 MG PO TABS: ORAL_TABLET | ORAL | 0 refills | Status: DC

## 2016-09-14 ENCOUNTER — Telehealth: Payer: Self-pay | Admitting: Internal Medicine

## 2016-09-19 MED ORDER — SILDENAFIL CITRATE 20 MG PO TABS: ORAL_TABLET | ORAL | 0 refills | Status: DC

## 2016-09-26 ENCOUNTER — Other Ambulatory Visit: Payer: Self-pay | Admitting: Internal Medicine

## 2016-09-26 MED ORDER — SILDENAFIL CITRATE 20 MG PO TABS: ORAL_TABLET | ORAL | 0 refills | Status: DC

## 2016-09-26 NOTE — Telephone Encounter (Signed)
Patient came by the office to pick up script for Sildenafil. Script was sent to [facility]. Patient stated that [facility]. Called [facility] and had them cancel the script. [facility], pharmacy changed in system.

## 2016-09-26 NOTE — Addendum Note (Signed)
Addended by: Sydell AxonLAWS, Erielle Gawronski C on: 09/26/2016 04:48 PM   Modules accepted: Orders

## 2016-09-27 ENCOUNTER — Encounter: Payer: Self-pay | Admitting: Internal Medicine

## 2016-09-27 MED ORDER — SILDENAFIL CITRATE 20 MG PO TABS: ORAL_TABLET | ORAL | 2 refills | Status: DC

## 2016-09-27 NOTE — Telephone Encounter (Signed)
Patient notified by telephone that script has been sent to the pharmacy. 

## 2016-10-31 ENCOUNTER — Other Ambulatory Visit: Payer: Self-pay | Admitting: Internal Medicine

## 2016-10-31 DIAGNOSIS — F988 Other specified behavioral and emotional disorders with onset usually occurring in childhood and adolescence: Secondary | ICD-10-CM

## 2016-11-01 MED ORDER — AMPHETAMINE-DEXTROAMPHETAMINE 10 MG PO TABS: 10.0000 mg | ORAL_TABLET | Freq: Every day | ORAL | 0 refills | Status: DC | PRN

## 2016-11-01 MED ORDER — LISDEXAMFETAMINE DIMESYLATE 50 MG PO CAPS: 50.0000 mg | ORAL_CAPSULE | Freq: Every day | ORAL | 0 refills | Status: DC

## 2016-11-01 NOTE — Telephone Encounter (Signed)
Rx left in front office for pick up and pt is aware  

## 2016-11-01 NOTE — Telephone Encounter (Signed)
Last filled 08/12/16--please advise 

## 2016-11-01 NOTE — Telephone Encounter (Signed)
RX printed and signed and placed in MYD box 

## 2016-11-30 ENCOUNTER — Other Ambulatory Visit: Payer: Self-pay | Admitting: Internal Medicine

## 2016-11-30 DIAGNOSIS — F988 Other specified behavioral and emotional disorders with onset usually occurring in childhood and adolescence: Secondary | ICD-10-CM

## 2016-11-30 NOTE — Telephone Encounter (Signed)
Last filled 11/01/16--please advise 

## 2016-12-01 ENCOUNTER — Encounter: Payer: Self-pay | Admitting: Internal Medicine

## 2016-12-01 MED ORDER — LISDEXAMFETAMINE DIMESYLATE 50 MG PO CAPS: 50.0000 mg | ORAL_CAPSULE | Freq: Every day | ORAL | 0 refills | Status: DC

## 2016-12-01 MED ORDER — AMPHETAMINE-DEXTROAMPHETAMINE 10 MG PO TABS: 10.0000 mg | ORAL_TABLET | Freq: Every day | ORAL | 0 refills | Status: DC | PRN

## 2016-12-01 NOTE — Telephone Encounter (Signed)
Rx left in front office for pick up and pt is aware  

## 2016-12-01 NOTE — Telephone Encounter (Signed)
RX printed and signed and placed in MYD box 

## 2016-12-02 ENCOUNTER — Encounter: Payer: Self-pay | Admitting: Internal Medicine

## 2016-12-05 ENCOUNTER — Encounter: Payer: Self-pay | Admitting: Internal Medicine

## 2016-12-07 ENCOUNTER — Ambulatory Visit (INDEPENDENT_AMBULATORY_CARE_PROVIDER_SITE_OTHER): Admitting: Internal Medicine

## 2016-12-07 ENCOUNTER — Encounter: Payer: Self-pay | Admitting: Internal Medicine

## 2016-12-07 VITALS — BP 120/78 | HR 78 | Temp 98.1°F | Wt 241.0 lb

## 2016-12-07 DIAGNOSIS — Z202 Contact with and (suspected) exposure to infections with a predominantly sexual mode of transmission: Secondary | ICD-10-CM | POA: Diagnosis not present

## 2016-12-07 NOTE — Progress Notes (Signed)
Subjective:    Patient ID: Marco GalasJoshua Allen, male    DOB: Jul 26, 1981, 36 y.o.   MRN: [id number]  HPI  Pt presents to the clinic today requesting STD screen. He reports he just found out that someone he has been sexually active with recently has tested positive for chlamydia. He denies penile discharge, dysuria or testicular pain. He is sexually active with only 1 partner at a time. He does not always use condoms.   Review of Systems      Past Medical History:  Diagnosis Date  . Chicken pox     Current Outpatient Prescriptions  Medication Sig Dispense Refill  . amphetamine-dextroamphetamine (ADDERALL) 10 MG tablet Take 1 tablet (10 mg total) by mouth daily as needed. 30 tablet 0  . lisdexamfetamine (VYVANSE) 50 MG capsule Take 1 capsule (50 mg total) by mouth daily. 30 capsule 0  . sildenafil (REVATIO) 20 MG tablet Take 2-5 tablets by mouth as needed for sexual activity 50 tablet 2   No current facility-administered medications for this visit.     No Known Allergies  Family History  Problem Relation Age of Onset  . Diabetes Paternal Uncle   . Cancer Maternal Grandmother   . Heart disease Maternal Grandfather   . Heart disease Paternal Grandfather     Social History   Social History  . Marital status: Married    Spouse name: N/A  . Number of children: N/A  . Years of education: N/A   Occupational History  . Not on file.   Social History Main Topics  . Smoking status: Light Tobacco Smoker  . Smokeless tobacco: Never Used     Comment: cigars 10-12 a year  . Alcohol use 2.4 oz/week    4 Shots of liquor per week     Comment: occasional  . Drug use: No  . Sexual activity: Yes   Other Topics Concern  . Not on file   Social History Narrative  . No narrative on file     Constitutional: Denies fever, malaise, fatigue, headache or abrupt weight changes.  GU: Denies urgency, frequency, pain with urination, burning sensation, blood in urine, odor or discharge.  No  other specific complaints in a complete review of systems (except as listed in HPI above).  Objective:   Physical Exam  BP 120/78   Pulse 78   Temp 98.1 F (36.7 C) (Oral)   Wt 241 lb (109.3 kg)   SpO2 98%   BMI 30.73 kg/m  Wt Readings from Last 3 Encounters:  12/07/16 241 lb (109.3 kg)  12/07/15 240 lb (108.9 kg)  08/04/15 233 lb (105.7 kg)    General: Appears his stated age, well developed, well nourished in NAD. Abdomen: Soft and nontender. Normal bowel sounds.   BMET    Component Value Date/Time   NA 136 12/07/2015 1553   K 4.1 12/07/2015 1553   CL 102 12/07/2015 1553   CO2 27 12/07/2015 1553   GLUCOSE 86 12/07/2015 1553   BUN 18 12/07/2015 1553   CREATININE 1.18 12/07/2015 1553   CALCIUM 9.6 12/07/2015 1553   GFRNONAA >60 04/05/2015 1113   GFRAA >60 04/05/2015 1113    Lipid Panel     Component Value Date/Time   CHOL 188 12/07/2015 1553   TRIG 97.0 12/07/2015 1553   HDL 53.10 12/07/2015 1553   CHOLHDL 4 12/07/2015 1553   VLDL 19.4 12/07/2015 1553   LDLCALC 116 (H) 12/07/2015 1553    CBC    Component Value  Date/Time   WBC 9.6 12/07/2015 1553   RBC 5.16 12/07/2015 1553   HGB 15.1 12/07/2015 1553   HCT 45.9 12/07/2015 1553   PLT 298.0 12/07/2015 1553   MCV 89.0 12/07/2015 1553   MCH 29.8 04/05/2015 1113   MCHC 32.9 12/07/2015 1553   RDW 13.5 12/07/2015 1553   LYMPHSABS 1.4 04/05/2015 1113   MONOABS 1.2 (H) 04/05/2015 1113   EOSABS 0.1 04/05/2015 1113   BASOSABS 0.1 04/05/2015 1113    Hgb A1C Lab Results  Component Value Date   HGBA1C 5.3 12/07/2015            Assessment & Plan:   Exposure to Chlamydia:  Urine G/C today We will call you with the results  RTC as needed or if symptoms persist or worsen BAITY, REGINA, NP

## 2016-12-07 NOTE — Patient Instructions (Signed)
Chlamydia, Male Chlamydia is an infection. It is spread through sexual contact. Chlamydia can be in different areas of the body. These areas include the urethra, throat, or rectum. It is important to treat chlamydia as soon as possible. It can damage other organs.  CAUSES  Chlamydia is caused by bacteria. It is a sexually transmitted disease. This means that it is passed from an infected partner during intimate contact. This contact could be with the genitals, mouth, or rectal area.  SIGNS AND SYMPTOMS  There may not be any symptoms. This is often the case early in the infection. If there are symptoms, they are usually mild and may only be noticeable in the morning. Symptoms you may notice include:   Burning with urination.  Pain or swelling in the testicles.  Watery mucus-like discharge from the penis.  Long-standing (chronic) pelvic pain after frequent infections.  Pain, swelling, or itching around the anus.  A sore throat.  Itching, burning, or redness in the eyes, or discharge from the eyes. DIAGNOSIS  To diagnose this infection, your health care provider will do a pelvic exam. A sample of urine or a swab from the rectum may be taken for testing.  TREATMENT  Chlamydia is treated with antibiotic medicines. Your health care provider may test you for infection again 3 months after treatment. HOME CARE INSTRUCTIONS  Take your antibiotic medicine as directed by your health care provider. Finish the antibiotic even if you start to feel better. Incomplete treatment will put you at risk for not being able to have children (sterility).   Take medicines only as directed by your health care provider.   Rest.   Inform any sexual partners about your infection. Even if they are symptom free or have a negative culture or evaluation, they should be treated for the condition.   Do not have sex (intercourse) until treatment is completed and your health care provider says it is okay.   Keep  all follow-up visits as directed by your health care provider.   Not all test results are available during your visit. If your test results are not back during the visit, make an appointment with your health care provider to find out the results. Do not assume everything is normal if you have not heard from your health care provider or the medical facility. It is your responsibility to get your test results. SEEK MEDICAL CARE IF:  You develop new joint pain.  You have a fever. SEEK IMMEDIATE MEDICAL CARE IF:   Your pain increases.   You have abnormal discharge.   You have pain during intercourse. MAKE SURE YOU:   Understand these instructions.  Will watch your condition.  Will get help right away if you are not doing well or get worse. This information is not intended to replace advice given to you by your health care provider. Make sure you discuss any questions you have with your health care provider. Document Released: 10/03/2005 Document Revised: 10/24/2014 Document Reviewed: 04/11/2013 Elsevier Interactive Patient Education  2017 [employer]

## 2016-12-07 NOTE — Addendum Note (Signed)
Addended by: Roena MaladyEVONTENNO, Melysa Schroyer Y on: 12/07/2016 05:03 PM   Modules accepted: Orders

## 2016-12-08 ENCOUNTER — Other Ambulatory Visit: Payer: Self-pay | Admitting: Internal Medicine

## 2016-12-08 LAB — GC/CHLAMYDIA PROBE AMP
CT Probe RNA: DETECTED — AB
GC Probe RNA: NOT DETECTED

## 2016-12-08 MED ORDER — AZITHROMYCIN 500 MG PO TABS: 1000.0000 mg | ORAL_TABLET | Freq: Once | ORAL | 0 refills | Status: DC

## 2016-12-08 MED ORDER — AZITHROMYCIN 500 MG PO TABS: 1000.0000 mg | ORAL_TABLET | Freq: Once | ORAL | 0 refills | Status: AC

## 2016-12-29 ENCOUNTER — Telehealth: Payer: Self-pay

## 2016-12-29 NOTE — Telephone Encounter (Signed)
Brandy with [facility] left v/m requesting treatment info for positive test results; needs to know what med pt was treated with and date of treatment. Per lab result note on 12/07/16 should pt have retested urine?

## 2016-12-30 NOTE — Telephone Encounter (Signed)
Brandi from the health dept called back and was given information she was requesting

## 2016-12-30 NOTE — Telephone Encounter (Signed)
Left message on voicemail.

## 2017-01-09 ENCOUNTER — Ambulatory Visit (INDEPENDENT_AMBULATORY_CARE_PROVIDER_SITE_OTHER): Admitting: Internal Medicine

## 2017-01-09 ENCOUNTER — Encounter: Payer: Self-pay | Admitting: Internal Medicine

## 2017-01-09 VITALS — BP 120/72 | HR 81 | Temp 97.9°F | Ht 74.5 in | Wt 236.8 lb

## 2017-01-09 DIAGNOSIS — Z0000 Encounter for general adult medical examination without abnormal findings: Secondary | ICD-10-CM

## 2017-01-09 DIAGNOSIS — N522 Drug-induced erectile dysfunction: Secondary | ICD-10-CM | POA: Diagnosis not present

## 2017-01-09 DIAGNOSIS — F988 Other specified behavioral and emotional disorders with onset usually occurring in childhood and adolescence: Secondary | ICD-10-CM | POA: Diagnosis not present

## 2017-01-09 LAB — COMPREHENSIVE METABOLIC PANEL
ALT: 28 U/L (ref 0–53)
AST: 21 U/L (ref 0–37)
Albumin: 4.8 g/dL (ref 3.5–5.2)
Alkaline Phosphatase: 72 U/L (ref 39–117)
BUN: 13 mg/dL (ref 6–23)
CALCIUM: 10 mg/dL (ref 8.4–10.5)
CHLORIDE: 105 meq/L (ref 96–112)
CO2: 29 meq/L (ref 19–32)
CREATININE: 1.17 mg/dL (ref 0.40–1.50)
GFR: 75.03 mL/min (ref >60.00)
Glucose, Bld: 96 mg/dL (ref 70–99)
Potassium: 4.3 mEq/L (ref 3.5–5.1)
Sodium: 139 mEq/L (ref 135–145)
Total Bilirubin: 0.6 mg/dL (ref 0.2–1.2)
Total Protein: 7.5 g/dL (ref 6.0–8.3)

## 2017-01-09 LAB — CBC
HCT: 47.7 % (ref 39.0–52.0)
Hemoglobin: 15.9 g/dL (ref 13.0–17.0)
MCHC: 33.4 g/dL (ref 30.0–36.0)
MCV: 87.9 fl (ref 78.0–100.0)
PLATELETS: 266 10*3/uL (ref 150.0–400.0)
RBC: 5.42 Mil/uL (ref 4.22–5.81)
RDW: 13.2 % (ref 11.5–15.5)
WBC: 6.8 10*3/uL (ref 4.0–10.5)

## 2017-01-09 LAB — LIPID PANEL
CHOL/HDL RATIO: 4
Cholesterol: 171 mg/dL (ref 0–200)
HDL: 48 mg/dL (ref >39.00)
LDL CALC: 105 mg/dL — AB (ref 0–99)
NonHDL: 122.88
TRIGLYCERIDES: 89 mg/dL (ref 0.0–149.0)
VLDL: 17.8 mg/dL (ref 0.0–40.0)

## 2017-01-09 MED ORDER — SILDENAFIL CITRATE 20 MG PO TABS: ORAL_TABLET | ORAL | 3 refills | Status: AC

## 2017-01-09 MED ORDER — AMPHETAMINE-DEXTROAMPHETAMINE 10 MG PO TABS: 10.0000 mg | ORAL_TABLET | Freq: Every day | ORAL | 0 refills | Status: DC | PRN

## 2017-01-09 MED ORDER — LISDEXAMFETAMINE DIMESYLATE 50 MG PO CAPS: 50.0000 mg | ORAL_CAPSULE | Freq: Every day | ORAL | 0 refills | Status: DC

## 2017-01-09 NOTE — Assessment & Plan Note (Signed)
Vyvanse and Adderall refilled today

## 2017-01-09 NOTE — Assessment & Plan Note (Signed)
Revatio refilled today 

## 2017-01-09 NOTE — Progress Notes (Signed)
Subjective:    Patient ID: Marco Allen, male    DOB: [birth year], 36 y.o.   MRN: [id number]  HPI  Pt presents to the clinic today for his annual exam. He is also due for follow up chronic conditions.  ADD: He has issues with motivation and difficulty concentrating. His symptoms are controlled on Vyvanse. He occasionally has to take Adderall on days that are longer, or if he has meetings after work. He is requesting a refill of Vyvanse and Adderall today.  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 He would like a refill of this today.  Flu: 11/2011 Tetanus: 09/2016 Dentist: biannually  Diet: He does eat meat. He consumes fruits and veggies daily. He does eat some fried foods. He drinks mostly Dt. Soda. Exercise: He usually runs 2-3 times a week for 3-4 miles.   Review of Systems      Past Medical History:  Diagnosis Date  . Chicken pox     Current Outpatient Prescriptions  Medication Sig Dispense Refill  . amphetamine-dextroamphetamine (ADDERALL) 10 MG tablet Take 1 tablet (10 mg total) by mouth daily as needed. 30 tablet 0  . lisdexamfetamine (VYVANSE) 50 MG capsule Take 1 capsule (50 mg total) by mouth daily. 30 capsule 0  . sildenafil (REVATIO) 20 MG tablet Take 2-5 tablets by mouth as needed for sexual activity 50 tablet 2   No current facility-administered medications for this visit.     No Known Allergies  Family History  Problem Relation Age of Onset  . Cancer Maternal Grandmother   . Heart disease Maternal Grandfather   . Heart disease Paternal Grandfather   . Diabetes Paternal Uncle     Social History   Social History  . Marital status: Married    Spouse name: N/A  . Number of children: N/A  . Years of education: N/A   Occupational History  . Not on file.   Social History Main Topics  . Smoking status: Light Tobacco Smoker  . Smokeless tobacco: Never Used     Comment: cigars 10-12 a year  . Alcohol use 2.4 oz/week      4 Shots of liquor per week     Comment: occasional  . Drug use: No  . Sexual activity: Yes   Other Topics Concern  . Not on file   Social History Narrative  . No narrative on file     Constitutional: Denies fever, malaise, fatigue, headache or abrupt weight changes.  HEENT: Denies eye pain, eye redness, ear pain, ringing in the ears, wax buildup, runny nose, nasal congestion, bloody nose, or sore throat. Respiratory: Denies difficulty breathing, shortness of breath, cough or sputum production.   Cardiovascular: Denies chest pain, chest tightness, palpitations or swelling in the hands or feet.  Gastrointestinal: Denies abdominal pain, bloating, constipation, diarrhea or blood in the stool.  GU: Denies urgency, frequency, pain with urination, burning sensation, blood in urine, odor or discharge. Musculoskeletal: Denies decrease in range of motion, difficulty with gait, muscle pain or joint pain and swelling.  Skin: Denies redness, rashes, lesions or ulcercations.  Neurological: Denies dizziness, difficulty with memory, difficulty with speech or problems with balance and coordination.  Psych: Denies anxiety, depression, SI/HI.  No other specific complaints in a complete review of systems (except as listed in HPI above).  Objective:   Physical Exam  BP 120/72   Pulse 81   Temp 97.9 F (36.6 C) (Oral)   Ht 6' 2.5" (1.892 m)  Wt 236 lb 12 oz (107.4 kg)   SpO2 98%   BMI 29.99 kg/m  Wt Readings from Last 3 Encounters:  01/09/17 236 lb 12 oz (107.4 kg)  12/07/16 241 lb (109.3 kg)  12/07/15 240 lb (108.9 kg)    General: Appears his stated age, well developed, well nourished in NAD. Skin: Warm, dry and intact. HEENT: Head: normal shape and size; Eyes: sclera white, no icterus, conjunctiva pink, PERRLA and EOMs intact; Ears: Tm's gray and intact, normal light reflex; Throat/Mouth: Teeth present, mucosa pink and moist, no exudate, lesions or ulcerations noted.  Neck:  Neck  supple, trachea midline. No masses, lumps or thyromegaly present.  Cardiovascular: Normal rate and rhythm. S1,S2 noted.  No murmur, rubs or gallops noted. No JVD or BLE edema.  Pulmonary/Chest: Normal effort and positive vesicular breath sounds. No respiratory distress. No wheezes, rales or ronchi noted.  Abdomen: Soft and nontender. Normal bowel sounds. No distention or masses noted. Liver, spleen and kidneys non palpable. Musculoskeletal: Strength 5/5 BUE/BLE. No difficulty with gait.  Neurological: Alert and oriented. Cranial nerves II-XII grossly intact. Coordination normal.  Psychiatric: Mood and affect normal. Behavior is normal. Judgment and thought content normal.    BMET    Component Value Date/Time   NA 136 12/07/2015 1553   K 4.1 12/07/2015 1553   CL 102 12/07/2015 1553   CO2 27 12/07/2015 1553   GLUCOSE 86 12/07/2015 1553   BUN 18 12/07/2015 1553   CREATININE 1.18 12/07/2015 1553   CALCIUM 9.6 12/07/2015 1553   GFRNONAA >60 04/05/2015 1113   GFRAA >60 04/05/2015 1113    Lipid Panel     Component Value Date/Time   CHOL 188 12/07/2015 1553   TRIG 97.0 12/07/2015 1553   HDL 53.10 12/07/2015 1553   CHOLHDL 4 12/07/2015 1553   VLDL 19.4 12/07/2015 1553   LDLCALC 116 (H) 12/07/2015 1553    CBC    Component Value Date/Time   WBC 9.6 12/07/2015 1553   RBC 5.16 12/07/2015 1553   HGB 15.1 12/07/2015 1553   HCT 45.9 12/07/2015 1553   PLT 298.0 12/07/2015 1553   MCV 89.0 12/07/2015 1553   MCH 29.8 04/05/2015 1113   MCHC 32.9 12/07/2015 1553   RDW 13.5 12/07/2015 1553   LYMPHSABS 1.4 04/05/2015 1113   MONOABS 1.2 (H) 04/05/2015 1113   EOSABS 0.1 04/05/2015 1113   BASOSABS 0.1 04/05/2015 1113    Hgb A1C Lab Results  Component Value Date   HGBA1C 5.3 12/07/2015            Assessment & Plan:   Preventative Health Maintenance:  Flu and tetanus UTD Encouraged him to consume a balanced diet and exercise regimen Advised him to see a dentist at least  annually Will check CBC, CMET, Lipid profile today  RTC in 1 year, sooner if needed Nicki ReaperBAITY, Clevon Khader, NP

## 2017-01-09 NOTE — Patient Instructions (Signed)
 Health Maintenance, Male A healthy lifestyle and preventive care is important for your health and wellness. Ask your health care provider about what schedule of regular examinations is right for you. What should I know about weight and diet?  Eat a Healthy Diet  Eat plenty of vegetables, fruits, whole grains, low-fat dairy products, and lean protein.  Do not eat a lot of foods high in solid fats, added sugars, or salt. Maintain a Healthy Weight  Regular exercise can help you achieve or maintain a healthy weight. You should:  Do at least 150 minutes of exercise each week. The exercise should increase your heart rate and make you sweat (moderate-intensity exercise).  Do strength-training exercises at least twice a week. Watch Your Levels of Cholesterol and Blood Lipids  Have your blood tested for lipids and cholesterol every 5 years starting at 35 years of age. If you are at high risk for heart disease, you should start having your blood tested when you are 36 years old. You may need to have your cholesterol levels checked more often if:  Your lipid or cholesterol levels are high.  You are older than 36 years of age.  You are at high risk for heart disease. What should I know about cancer screening? Many types of cancers can be detected early and may often be prevented. Lung Cancer  You should be screened every year for lung cancer if:  You are a current smoker who has smoked for at least 30 years.  You are a former smoker who has quit within the past 15 years.  Talk to your health care provider about your screening options, when you should start screening, and how often you should be screened. Colorectal Cancer  Routine colorectal cancer screening usually begins at 36 years of age and should be repeated every 5-10 years until you are 36 years old. You may need to be screened more often if early forms of precancerous polyps or small growths are found. Your health care provider  may recommend screening at an earlier age if you have risk factors for colon cancer.  Your health care provider may recommend using home test kits to check for hidden blood in the stool.  A small camera at the end of a tube can be used to examine your colon (sigmoidoscopy or colonoscopy). This checks for the earliest forms of colorectal cancer. Prostate and Testicular Cancer  Depending on your age and overall health, your health care provider may do certain tests to screen for prostate and testicular cancer.  Talk to your health care provider about any symptoms or concerns you have about testicular or prostate cancer. Skin Cancer  Check your skin from head to toe regularly.  Tell your health care provider about any new moles or changes in moles, especially if:  There is a change in a mole's size, shape, or color.  You have a mole that is larger than a pencil eraser.  Always use sunscreen. Apply sunscreen liberally and repeat throughout the day.  Protect yourself by wearing long sleeves, pants, a wide-brimmed hat, and sunglasses when outside. What should I know about heart disease, diabetes, and high blood pressure?  If you are 18-39 years of age, have your blood pressure checked every 3-5 years. If you are 40 years of age or older, have your blood pressure checked every year. You should have your blood pressure measured twice-once when you are at a hospital or clinic, and once when you are not at   a hospital or clinic. Record the average of the two measurements. To check your blood pressure when you are not at a hospital or clinic, you can use:  An automated blood pressure machine at a pharmacy.  A home blood pressure monitor.  Talk to your health care provider about your target blood pressure.  If you are between 45-79 years old, ask your health care provider if you should take aspirin to prevent heart disease.  Have regular diabetes screenings by checking your fasting blood sugar  level.  If you are at a normal weight and have a low risk for diabetes, have this test once every three years after the age of 45.  If you are overweight and have a high risk for diabetes, consider being tested at a younger age or more often.  A one-time screening for abdominal aortic aneurysm (AAA) by ultrasound is recommended for men aged 65-75 years who are current or former smokers. What should I know about preventing infection? Hepatitis B  If you have a higher risk for hepatitis B, you should be screened for this virus. Talk with your health care provider to find out if you are at risk for hepatitis B infection. Hepatitis C  Blood testing is recommended for:  Everyone born from 1945 through 1965.  Anyone with known risk factors for hepatitis C. Sexually Transmitted Diseases (STDs)  You should be screened each year for STDs including gonorrhea and chlamydia if:  You are sexually active and are younger than 36 years of age.  You are older than 36 years of age and your health care provider tells you that you are at risk for this type of infection.  Your sexual activity has changed since you were last screened and you are at an increased risk for chlamydia or gonorrhea. Ask your health care provider if you are at risk.  Talk with your health care provider about whether you are at high risk of being infected with HIV. Your health care provider may recommend a prescription medicine to help prevent HIV infection. What else can I do?  Schedule regular health, dental, and eye exams.  Stay current with your vaccines (immunizations).  Do not use any tobacco products, such as cigarettes, chewing tobacco, and e-cigarettes. If you need help quitting, ask your health care provider.  Limit alcohol intake to no more than 2 drinks per day. One drink equals 12 ounces of beer, 5 ounces of wine, or 1 ounces of hard liquor.  Do not use street drugs.  Do not share needles.  Ask your health  care provider for help if you need support or information about quitting drugs.  Tell your health care provider if you often feel depressed.  Tell your health care provider if you have ever been abused or do not feel safe at home. This information is not intended to replace advice given to you by your health care provider. Make sure you discuss any questions you have with your health care provider. Document Released: 03/31/2008 Document Revised: 06/01/2016 Document Reviewed: 07/07/2015 Elsevier Interactive Patient Education  2017 [employer]  

## 2017-03-02 ENCOUNTER — Other Ambulatory Visit: Payer: Self-pay | Admitting: Internal Medicine

## 2017-03-02 DIAGNOSIS — F988 Other specified behavioral and emotional disorders with onset usually occurring in childhood and adolescence: Secondary | ICD-10-CM

## 2017-03-03 MED ORDER — AMPHETAMINE-DEXTROAMPHETAMINE 10 MG PO TABS: 10.0000 mg | ORAL_TABLET | Freq: Every day | ORAL | 0 refills | Status: DC | PRN

## 2017-03-03 MED ORDER — LISDEXAMFETAMINE DIMESYLATE 50 MG PO CAPS: 50.0000 mg | ORAL_CAPSULE | Freq: Every day | ORAL | 0 refills | Status: DC

## 2017-03-03 NOTE — Telephone Encounter (Signed)
Last filled 01/09/17--please advise 

## 2017-03-03 NOTE — Telephone Encounter (Signed)
Rx left in front office for pick up and pt is aware  

## 2017-03-03 NOTE — Telephone Encounter (Signed)
RX printed and signed and placed in MYD box 

## 2017-04-05 ENCOUNTER — Other Ambulatory Visit: Payer: Self-pay | Admitting: Internal Medicine

## 2017-04-05 DIAGNOSIS — F988 Other specified behavioral and emotional disorders with onset usually occurring in childhood and adolescence: Secondary | ICD-10-CM

## 2017-04-05 NOTE — Telephone Encounter (Signed)
Last filled 03/03/2017... Please advise

## 2017-04-06 ENCOUNTER — Encounter: Payer: Self-pay | Admitting: Internal Medicine

## 2017-04-06 ENCOUNTER — Ambulatory Visit (INDEPENDENT_AMBULATORY_CARE_PROVIDER_SITE_OTHER): Admitting: Internal Medicine

## 2017-04-06 VITALS — BP 120/78 | HR 66 | Temp 98.1°F | Wt 226.0 lb

## 2017-04-06 DIAGNOSIS — Z118 Encounter for screening for other infectious and parasitic diseases: Secondary | ICD-10-CM

## 2017-04-06 MED ORDER — AMPHETAMINE-DEXTROAMPHETAMINE 10 MG PO TABS: 10.0000 mg | ORAL_TABLET | Freq: Every day | ORAL | 0 refills | Status: AC | PRN

## 2017-04-06 MED ORDER — LISDEXAMFETAMINE DIMESYLATE 50 MG PO CAPS: 50.0000 mg | ORAL_CAPSULE | Freq: Every day | ORAL | 0 refills | Status: AC

## 2017-04-06 NOTE — Telephone Encounter (Signed)
RX printed and signed and placed in MYD box 

## 2017-04-06 NOTE — Progress Notes (Signed)
Subjective:    Patient ID: Marco GalasJoshua Allen, male    DOB: [birth year], 36 y.o.   MRN: [id number]  HPI  Pt presents to the clinic today to follow up positive chlamydia testing. He was tested 12/07/16 and treated with Azithromycin at that time. He denies any dysuria, penile discharge or testicular pain. He continues to be sexually active, only 1 partner at a time. He does not always use condoms with each sexual encounter.  Review of Systems  Past Medical History:  Diagnosis Date  . Chicken pox     Current Outpatient Prescriptions  Medication Sig Dispense Refill  . amphetamine-dextroamphetamine (ADDERALL) 10 MG tablet Take 1 tablet (10 mg total) by mouth daily as needed. 30 tablet 0  . lisdexamfetamine (VYVANSE) 50 MG capsule Take 1 capsule (50 mg total) by mouth daily. 30 capsule 0  . sildenafil (REVATIO) 20 MG tablet Take 2-5 tablets by mouth as needed for sexual activity 150 tablet 3   No current facility-administered medications for this visit.     No Known Allergies  Family History  Problem Relation Age of Onset  . Cancer Maternal Grandmother   . Heart disease Maternal Grandfather   . Heart disease Paternal Grandfather   . Diabetes Paternal Uncle     Social History   Social History  . Marital status: Married    Spouse name: N/A  . Number of children: N/A  . Years of education: N/A   Occupational History  . Not on file.   Social History Main Topics  . Smoking status: Light Tobacco Smoker  . Smokeless tobacco: Never Used     Comment: cigars 10-12 a year  . Alcohol use 2.4 oz/week    4 Shots of liquor per week     Comment: occasional  . Drug use: No  . Sexual activity: Yes   Other Topics Concern  . Not on file   Social History Narrative  . No narrative on file     Constitutional: Denies fever, malaise, fatigue, headache or abrupt weight changes.  Gastrointestinal: Denies abdominal pain, bloating, constipation, diarrhea or blood in the stool.  GU: Denies  urgency, frequency, pain with urination, burning sensation, blood in urine, odor or discharge.   No other specific complaints in a complete review of systems (except as listed in HPI above).     Objective:   Physical Exam   BP 120/78   Pulse 66   Temp 98.1 F (36.7 C) (Oral)   Wt 226 lb (102.5 kg)   SpO2 97%   BMI 28.63 kg/m                                                                 Wt Readings from Last 3 Encounters:  04/06/17 226 lb (102.5 kg)  01/09/17 236 lb 12 oz (107.4 kg)  12/07/16 241 lb (109.3 kg)    General: Appears his stated age, well developed, well nourished in NAD. Abdomen: Soft and nontender. Normal bowel sounds.  BMET    Component Value Date/Time   NA 139 01/09/2017 1449   K 4.3 01/09/2017 1449   CL 105 01/09/2017 1449   CO2 29 01/09/2017 1449   GLUCOSE 96 01/09/2017 1449   BUN 13 01/09/2017 1449   CREATININE 1.17 01/09/2017  1449   CALCIUM 10.0 01/09/2017 1449   GFRNONAA >60 04/05/2015 1113   GFRAA >60 04/05/2015 1113    Lipid Panel     Component Value Date/Time   CHOL 171 01/09/2017 1449   TRIG 89.0 01/09/2017 1449   HDL 48.00 01/09/2017 1449   CHOLHDL 4 01/09/2017 1449   VLDL 17.8 01/09/2017 1449   LDLCALC 105 (H) 01/09/2017 1449    CBC    Component Value Date/Time   WBC 6.8 01/09/2017 1449   RBC 5.42 01/09/2017 1449   HGB 15.9 01/09/2017 1449   HCT 47.7 01/09/2017 1449   PLT 266.0 01/09/2017 1449   MCV 87.9 01/09/2017 1449   MCH 29.8 04/05/2015 1113   MCHC 33.4 01/09/2017 1449   RDW 13.2 01/09/2017 1449   LYMPHSABS 1.4 04/05/2015 1113   MONOABS 1.2 (H) 04/05/2015 1113   EOSABS 0.1 04/05/2015 1113   BASOSABS 0.1 04/05/2015 1113    Hgb A1C Lab Results  Component Value Date   HGBA1C 5.3 12/07/2015           Assessment & Plan:   Screening for Chlamydia:  Urine gen probe today Discussed the importance of safe sex by using condoms with each sexual encounter  Will follow up after his test results are back, RTC  as needed Nicki Reaper, NP

## 2017-04-06 NOTE — Addendum Note (Signed)
Addended by: Roena MaladyEVONTENNO, Ambermarie Honeyman Y on: 04/06/2017 10:29 AM   Modules accepted: Orders

## 2017-04-06 NOTE — Telephone Encounter (Signed)
Pt has appt today will give at OV

## 2017-04-06 NOTE — Patient Instructions (Signed)

## 2017-04-07 LAB — GC/CHLAMYDIA PROBE AMP
CT PROBE, AMP APTIMA: NOT DETECTED
GC PROBE AMP APTIMA: NOT DETECTED

## 2017-05-05 ENCOUNTER — Encounter: Payer: Self-pay | Admitting: Internal Medicine
